# Patient Record
Sex: Female | Born: 1965 | Race: White | Hispanic: No | Marital: Married | Smoking: Former smoker
Health system: Southern US, Community
[De-identification: ages and names within clinical notes are randomized; demographics above are authoritative.]

## PROBLEM LIST (undated history)

## (undated) DIAGNOSIS — R51 Headache: Secondary | ICD-10-CM

## (undated) DIAGNOSIS — Z87442 Personal history of urinary calculi: Secondary | ICD-10-CM

## (undated) DIAGNOSIS — R519 Headache, unspecified: Secondary | ICD-10-CM

## (undated) HISTORY — PX: BACK SURGERY: SHX140

## (undated) HISTORY — DX: Headache: R51

## (undated) HISTORY — DX: Headache, unspecified: R51.9

---

## 1998-08-02 ENCOUNTER — Ambulatory Visit (HOSPITAL_COMMUNITY): Admission: RE | Admit: 1998-08-02 | Discharge: 1998-08-02 | Payer: Self-pay | Admitting: Obstetrics & Gynecology

## 1998-10-22 ENCOUNTER — Inpatient Hospital Stay (HOSPITAL_COMMUNITY): Admission: AD | Admit: 1998-10-22 | Discharge: 1998-10-26 | Payer: Self-pay | Admitting: Obstetrics and Gynecology

## 1998-12-12 ENCOUNTER — Other Ambulatory Visit: Admission: RE | Admit: 1998-12-12 | Discharge: 1998-12-12 | Payer: Self-pay | Admitting: Obstetrics and Gynecology

## 2000-02-10 ENCOUNTER — Other Ambulatory Visit: Admission: RE | Admit: 2000-02-10 | Discharge: 2000-02-10 | Payer: Self-pay | Admitting: Obstetrics and Gynecology

## 2001-02-11 ENCOUNTER — Other Ambulatory Visit: Admission: RE | Admit: 2001-02-11 | Discharge: 2001-02-11 | Payer: Self-pay | Admitting: Obstetrics and Gynecology

## 2002-03-10 ENCOUNTER — Other Ambulatory Visit: Admission: RE | Admit: 2002-03-10 | Discharge: 2002-03-10 | Payer: Self-pay | Admitting: Obstetrics and Gynecology

## 2003-08-14 ENCOUNTER — Other Ambulatory Visit: Admission: RE | Admit: 2003-08-14 | Discharge: 2003-08-14 | Payer: Self-pay | Admitting: Obstetrics and Gynecology

## 2004-11-22 ENCOUNTER — Other Ambulatory Visit: Admission: RE | Admit: 2004-11-22 | Discharge: 2004-11-22 | Payer: Self-pay | Admitting: Obstetrics and Gynecology

## 2014-02-15 ENCOUNTER — Telehealth: Payer: Self-pay

## 2014-02-15 ENCOUNTER — Ambulatory Visit: Payer: BC Managed Care – PPO | Admitting: Family Medicine

## 2014-02-15 VITALS — BP 122/82 | HR 83 | Temp 97.8°F | Resp 16 | Ht 64.0 in | Wt 184.0 lb

## 2014-02-15 DIAGNOSIS — E663 Overweight: Secondary | ICD-10-CM

## 2014-02-15 DIAGNOSIS — M109 Gout, unspecified: Secondary | ICD-10-CM

## 2014-02-15 MED ORDER — COLCHICINE 0.6 MG PO TABS: ORAL_TABLET | ORAL | Status: DC

## 2014-02-15 MED ORDER — INDOMETHACIN 50 MG PO CAPS: 50.0000 mg | ORAL_CAPSULE | Freq: Three times a day (TID) | ORAL | Status: DC

## 2014-02-15 NOTE — Telephone Encounter (Signed)
Called her back and LMOM.  Assuming she picked up the indomethacin- yes, this will be helpful on its own most likely.  Let me know if not better in the next couple of days.  If she got the colchicine, then add OTC ibuprofen

## 2014-02-15 NOTE — Patient Instructions (Addendum)
We are going to treat you for gout with colchicine (you can save the rest of the rx to use if you have gout again in the future.   You can also use the indomethacin for pain and inflammation- just use this as needed  Let me know if you are not better soon, or if this becomes a chronic problem

## 2014-02-15 NOTE — Telephone Encounter (Signed)
COPLAND - Patient said one of the medicines that she was prescribed for the gout is to expensive.  Will the anti-inflammatory medicine clear up the gout by itself?  If not and she does need the second one, is there a generic or cheaper medication she could take?  [phone]

## 2014-02-15 NOTE — Progress Notes (Signed)
[facility] [address], [place] [id number] [phone]  Date:  02/15/2014   Name:  Sharon KarvonenSuzanne M Robbins   DOB:  09/22/1966   MRN:  [id number]  PCP:  No PCP Per Patient    Chief Complaint: Foot Pain   History of Present Illness:  Sharon KarvonenSuzanne M Robbins is a 48 y.o. very pleasant female patient who presents with the following:  She awoke a couple of days ago and noted pain and swelling in her right 1st MTP.  She has never had this in the past, she has never had gout before.  However she did some reading online and thought this may be gout.  She has not eaten anything out of the ordinary although she did eat some shrimp the night before her sx began.  She did not injure the area.    She currently has her menses She is generally healthy otherwise except for being overweight   There are no active problems to display for this patient.   Past Medical History  Diagnosis Date  . Headache     Past Surgical History  Procedure Laterality Date  . Cesarean section      History  Substance Use Topics  . Smoking status: Former Games developermoker  . Smokeless tobacco: Not on file  . Alcohol Use: Not on file    Family History  Problem Relation Age of Onset  . Lupus Mother   . Cancer Father   . Cancer Maternal Grandmother   . Stroke Paternal Grandmother     No Known Allergies  Medication list has been reviewed and updated.  No current outpatient prescriptions on file prior to visit.   No current facility-administered medications on file prior to visit.    Review of Systems:  As per HPI- otherwise negative.   Physical Examination: Filed Vitals:   02/15/14 1314  BP: 122/82  Pulse: 83  Temp: 97.8 F (36.6 C)  Resp: 16   Filed Vitals:   02/15/14 1314  Height: 5\' 4"  (1.626 m)  Weight: 184 lb (83.462 kg)   Body mass index is 31.57 kg/(m^2). Ideal Body Weight: Weight in (lb) to have BMI = 25: 145.3  GEN: WDWN, NAD, Non-toxic, A & O x 3 HEENT: Atraumatic,  Normocephalic. Neck supple. No masses, No LAD. Ears and Nose: No external deformity. CV: RRR, No M/G/R. No JVD. No thrill. No extra heart sounds. PULM: CTA B, no wheezes, crackles, rhonchi. No retractions. No resp. distress. No accessory muscle use. ABD: S, NT, ND, +BS. No rebound. No HSM. EXTR: No c/c/e NEURO Normal gait.  PSYCH: Normally interactive. Conversant. Not depressed or anxious appearing.  Calm demeanor.  Right foot: tenderness and mild swelling/ mild redness and warmth at first MTP.  Likely gout.  Otherwise the knee, calf, foot and ankle are negative  Assessment and Plan: Gout of big toe - Plan: colchicine 0.6 MG tablet, indomethacin (INDOCIN) 50 MG capsule treat for likely gout with colchicine and indomethacin as above.  She will let me know if not better soon- Sooner if worse.      Signed Abbe AmsterdamJessica Copland, MD

## 2014-02-17 ENCOUNTER — Telehealth: Payer: Self-pay

## 2014-02-17 NOTE — Telephone Encounter (Signed)
Pt called, reported that in the morning her gout is improved, but then as she is up on her foot pain worsens during the day and is still definitely swollen. She did not p/up the colchicine bc it costs $75 and was trying the indomethacin first. Pt asked if there is any less expensive med that would clear out the uric acid, or if the gout will clear up on its own soon without any other med, or if Dr Patsy Lageropland recommends just getting the colchicine? Please advise.

## 2014-02-17 NOTE — Telephone Encounter (Signed)
Called her back- LMOM.  Call pharmacy and ask if she could get just a few pills for cheaper- maybe 5 or 10. That way she can try it and see if effective Let me know if any other concerns

## 2014-06-28 ENCOUNTER — Telehealth: Payer: Self-pay

## 2014-06-28 NOTE — Telephone Encounter (Signed)
Left message on machine to call back  

## 2014-06-28 NOTE — Telephone Encounter (Signed)
Spoke with pt. Advised strongly that they would have to RTC. She says their whole family has lice and that the 2 children were able to get something called in by their peds but not for the pt and her husband. She says she is unable to afford to come in and begged me to ask if we could do this. Please advise. Thanks

## 2014-06-28 NOTE — Telephone Encounter (Signed)
1. Best treatment I've ever used for lice is Cetaphil lotion treatment (instructions on line; worked much better for me than prescription product).  2. What other OTC treatments has she tried?  What were their kids given?

## 2014-06-28 NOTE — Telephone Encounter (Addendum)
Patient has tried OTC remedies to treat her lice. She wants a script called in her to help her get rid of it. [facility]. Patient also requested a script for her husband [name] DOB: 05/20/1962 however he has not been seen in [facility] at [facility] ever. He needs an OV possibly. Please advise   [phone]

## 2014-06-28 NOTE — Telephone Encounter (Signed)
I spoke with this patient describing methods for lice treatment and recommendations.  She is going to try the Cetaphil cleanser method, recommended by both me and her daughter's pediatrician's office.

## 2014-07-12 ENCOUNTER — Telehealth: Payer: Self-pay

## 2014-07-12 NOTE — Telephone Encounter (Signed)
Pt have questions regarding gout, was seen before for it. Please call [phone]

## 2014-07-22 ENCOUNTER — Emergency Department (HOSPITAL_COMMUNITY): Payer: BC Managed Care – PPO

## 2014-07-22 ENCOUNTER — Encounter (HOSPITAL_COMMUNITY): Payer: Self-pay | Admitting: Emergency Medicine

## 2014-07-22 ENCOUNTER — Emergency Department (HOSPITAL_COMMUNITY)
Admission: EM | Admit: 2014-07-22 | Discharge: 2014-07-22 | Disposition: A | Discharge status: Home / Self Care | Payer: BC Managed Care – PPO | Attending: Emergency Medicine | Admitting: Emergency Medicine

## 2014-07-22 DIAGNOSIS — M5441 Lumbago with sciatica, right side: Secondary | ICD-10-CM | POA: Diagnosis not present

## 2014-07-22 DIAGNOSIS — M5442 Lumbago with sciatica, left side: Secondary | ICD-10-CM | POA: Diagnosis not present

## 2014-07-22 DIAGNOSIS — M545 Low back pain: Secondary | ICD-10-CM | POA: Diagnosis present

## 2014-07-22 DIAGNOSIS — Z79899 Other long term (current) drug therapy: Secondary | ICD-10-CM | POA: Diagnosis not present

## 2014-07-22 MED ORDER — FENTANYL CITRATE 0.05 MG/ML IJ SOLN
50.0000 ug | Freq: Once | INTRAMUSCULAR | Status: AC
  Administered 2014-07-22: 50 ug via INTRAVENOUS

## 2014-07-22 MED ORDER — OXYCODONE HCL 5 MG PO TABS
5.0000 mg | ORAL_TABLET | ORAL | Status: AC | PRN
Start: 2014-07-22 — End: ?

## 2014-07-22 MED ORDER — ONDANSETRON HCL 4 MG/2ML IJ SOLN
INTRAMUSCULAR | Status: DC
Start: 2014-07-22 — End: 2014-07-23
  Filled 2014-07-22: qty 2

## 2014-07-22 MED ORDER — HYDROMORPHONE HCL 1 MG/ML IJ SOLN
1.0000 mg | Freq: Once | INTRAMUSCULAR | Status: AC
  Administered 2014-07-22: 1 mg via INTRAVENOUS
  Filled 2014-07-22: qty 1

## 2014-07-22 MED ORDER — ONDANSETRON HCL 4 MG/2ML IJ SOLN
4.0000 mg | Freq: Once | INTRAMUSCULAR | Status: AC
  Administered 2014-07-22: 4 mg via INTRAVENOUS

## 2014-07-22 MED ORDER — DIAZEPAM 5 MG/ML IJ SOLN
5.0000 mg | Freq: Once | INTRAMUSCULAR | Status: AC
  Administered 2014-07-22: 5 mg via INTRAVENOUS
  Filled 2014-07-22: qty 2

## 2014-07-22 MED ORDER — OXYCODONE-ACETAMINOPHEN 10-325 MG PO TABS: 1.0000 | ORAL_TABLET | ORAL | Status: DC | PRN

## 2014-07-22 MED ORDER — HYDROMORPHONE HCL 1 MG/ML IJ SOLN
1.0000 mg | Freq: Once | INTRAMUSCULAR | Status: AC
Start: 2014-07-22 — End: 2014-07-22
  Administered 2014-07-22: 1 mg via INTRAVENOUS
  Filled 2014-07-22: qty 1

## 2014-07-22 MED ORDER — FENTANYL CITRATE 0.05 MG/ML IJ SOLN
INTRAMUSCULAR | Status: DC
Start: 2014-07-22 — End: 2014-07-23
  Filled 2014-07-22: qty 2

## 2014-07-22 MED ORDER — LUBIPROSTONE 24 MCG PO CAPS: 24.0000 ug | ORAL_CAPSULE | Freq: Two times a day (BID) | ORAL | Status: AC

## 2014-07-22 NOTE — ED Notes (Signed)
Pt returned back to room following MRI

## 2014-07-22 NOTE — Discharge Instructions (Signed)
1. Medications: Percocet for pain, Roxicodone for breakthrough pain, usual home medications 2. Treatment: rest, drink plenty of fluids, continue to use your antiinflammatories and muscle relaxers 3. Follow Up: Please followup with Dr. Ranell PatrickNorris in 3 days for discussion of your diagnoses and further evaluation after today's visit;     Back Pain, Adult Back pain is very common. The pain often gets better over time. The cause of back pain is usually not dangerous. Most people can learn to manage their back pain on their own.  HOME CARE   Stay active. Start with short walks on flat ground if you can. Try to walk farther each day.  Do not sit, drive, or stand in one place for more than 30 minutes. Do not stay in bed.  Do not avoid exercise or work. Activity can help your back heal faster.  Be careful when you bend or lift an object. Bend at your knees, keep the object close to you, and do not twist.  Sleep on a firm mattress. Lie on your side, and bend your knees. If you lie on your back, put a pillow under your knees.  Only take medicines as told by your doctor.  Put ice on the injured area.  Put ice in a plastic bag.  Place a towel between your skin and the bag.  Leave the ice on for 15-20 minutes, 03-04 times a day for the first 2 to 3 days. After that, you can switch between ice and heat packs.  Ask your doctor about back exercises or massage.  Avoid feeling anxious or stressed. Find good ways to deal with stress, such as exercise. GET HELP RIGHT AWAY IF:   Your pain does not go away with rest or medicine.  Your pain does not go away in 1 week.  You have new problems.  You do not feel well.  The pain spreads into your legs.  You cannot control when you poop (bowel movement) or pee (urinate).  Your arms or legs feel weak or lose feeling (numbness).  You feel sick to your stomach (nauseous) or throw up (vomit).  You have belly (abdominal) pain.  You feel like you may  pass out (faint). MAKE SURE YOU:   Understand these instructions.  Will watch your condition.  Will get help right away if you are not doing well or get worse. Document Released: 03/10/2008 Document Revised: 12/15/2011 Document Reviewed: 01/24/2014 [facility] Patient Information 2015 [place] [place]. This information is not intended to replace advice given to you by your health care provider. Make sure you discuss any questions you have with your health care provider.

## 2014-07-22 NOTE — ED Notes (Signed)
EDP at bedside  

## 2014-07-22 NOTE — ED Notes (Signed)
Hannah, PA at bedside  

## 2014-07-22 NOTE — ED Notes (Signed)
Per pt sts 3 weeks or worsening back pain. sts was sent here by doctor for MRI. Pt unable to sit or lay down due to the pain,.

## 2014-07-22 NOTE — ED Provider Notes (Signed)
CSN: [id number]     Arrival date & time 07/22/14  1810 History   First MD Initiated Contact with Patient 07/22/14 1847     Chief Complaint  Patient presents with  . Back Pain     (Consider location/radiation/quality/duration/timing/severity/associated sxs/prior Treatment) The history is provided by the patient and medical records. No language interpreter was used.    Sharon Robbins is a 48 y.o. female  with Hx of DDD, scoliosis and arthritis in her low back presents to the Emergency Department complaining of gradual, persistent, progressively worsening low back pain onset 3 weeks ago worsening 3 days ago.  Pt was seen by Dr. Ranell PatrickNorris on 07/20/14 and was given Vicodin and robaxin without relief.  Pt reports she texted Dr. Ranell PatrickNorris to tell him she was here and he was to call the ED.  Pt reports the fentanyl was not helpful.  Pt reports the pain is sometimes dull and achy when she is lying but it becomes sharp with movement.   Pt reports the pain is worse on the left.  Pt reports her chronic pain is normally a 3-5/10 but pain becomes a 10/10 with movement.  Pt reports the pain radiates down both legs to approx the knee bilaterally.  She denies saddle anesthesia, gait disturbance, loss of bowel or bladder control. Associated symptoms include low back spasms.  Pt denies fever, chills, headache, neck pain chest pain, shortness of breath, abdominal pain, nausea, vomiting, diarrhea, weakness in her legs, numbness, tingling, gait disturbance. Patient denies IV drug use, use of anticoagulants, recent surgeries.     Past Medical History  Diagnosis Date  . Headache    Past Surgical History  Procedure Laterality Date  . Cesarean section     Family History  Problem Relation Age of Onset  . Lupus Mother   . Cancer Father   . Cancer Maternal Grandmother   . Stroke Paternal Grandmother    History  Substance Use Topics  . Smoking status: Former Games developermoker  . Smokeless tobacco: Not on file  . Alcohol Use:  Not on file   OB History   Grav Para Term Preterm Abortions TAB SAB Ect Mult Living                 Review of Systems  Constitutional: Negative for fever and fatigue.  Respiratory: Negative for chest tightness and shortness of breath.   Cardiovascular: Negative for chest pain.  Gastrointestinal: Negative for nausea, vomiting, abdominal pain and diarrhea.  Genitourinary: Negative for dysuria, urgency, frequency and hematuria.  Musculoskeletal: Positive for back pain and gait problem ( 2/2 pain). Negative for joint swelling, neck pain and neck stiffness.  Skin: Negative for rash.  Neurological: Negative for weakness, light-headedness, numbness and headaches.  All other systems reviewed and are negative.     Allergies  Review of patient's allergies indicates no known allergies.  Home Medications   Prior to Admission medications   Medication Sig Start Date End Date Taking? Authorizing Provider  clobetasol cream (TEMOVATE) 0.05 % Apply 1 application topically 2 (two) times daily.   Yes Historical Provider, MD  HYDROcodone-acetaminophen (NORCO/VICODIN) 5-325 MG per tablet Take 1-2 tablets by mouth every 6 (six) hours as needed for moderate pain.   Yes Historical Provider, MD  ibuprofen (ADVIL,MOTRIN) 200 MG tablet Take 400 mg by mouth every 6 (six) hours as needed for moderate pain.   Yes Historical Provider, MD  imipramine (TOFRANIL) 50 MG tablet Take 50 mg by mouth at bedtime.  Yes Historical Provider, MD  methocarbamol (ROBAXIN) 500 MG tablet Take 500 mg by mouth every 6 (six) hours as needed for muscle spasms.   Yes Historical Provider, MD  oxyCODONE (ROXICODONE) 5 MG immediate release tablet Take 1 tablet (5 mg total) by mouth every 4 (four) hours as needed for breakthrough pain. 07/22/14   Jylian Pappalardo, PA-C  oxyCODONE-acetaminophen (PERCOCET) 10-325 MG per tablet Take 1 tablet by mouth every 4 (four) hours as needed for pain. 07/22/14   Reighn Kaplan, PA-C   BP  102/72  Pulse 89  Temp(Src) 98.3 F (36.8 C)  Resp 18  SpO2 92%  LMP 02/12/2014 Physical Exam  Nursing note and vitals reviewed. Constitutional: She appears well-developed and well-nourished. No distress.  HENT:  Head: Normocephalic and atraumatic.  Mouth/Throat: Oropharynx is clear and moist. No oropharyngeal exudate.  Eyes: Conjunctivae are normal.  Neck: Normal range of motion. Neck supple.  Full ROM without pain  Cardiovascular: Normal rate, regular rhythm, normal heart sounds and intact distal pulses.   No murmur heard. Pulmonary/Chest: Effort normal and breath sounds normal. No respiratory distress. She has no wheezes.  Abdominal: Soft. Bowel sounds are normal. She exhibits no distension. There is no tenderness.  Musculoskeletal:  Full range of motion of the T-spine and L-spine after pain control Mild tenderness to palpation of the spinous processes of the L-spine Significant tenderness to palpation of the bilateral paraspinous muscles of the L-spine  Lymphadenopathy:    She has no cervical adenopathy.  Neurological: She is alert. She has normal reflexes. She exhibits normal muscle tone. Coordination normal.  Speech is clear and goal oriented, follows commands Normal 5/5 strength in upper and lower extremities bilaterally including dorsiflexion and plantar flexion, strong and equal grip strength Sensation normal to light and sharp touch Moves extremities without ataxia, coordination intact Antalgic gait without foot drop or dragging of either leg Normal balance  Skin: Skin is warm and dry. No rash noted. She is not diaphoretic. No erythema.  Psychiatric: She has a normal mood and affect. Her behavior is normal.    ED Course  Procedures (including critical care time) Labs Review Labs Reviewed - No data to display  Imaging Review Mr Lumbar Spine Wo Contrast  07/22/2014   CLINICAL DATA:  Low back pain radiating into the legs and left hip for 1 week.  EXAM: MRI LUMBAR  SPINE WITHOUT CONTRAST  TECHNIQUE: Multiplanar, multisequence MR imaging of the lumbar spine was performed. No intravenous contrast was administered.  COMPARISON:  None.  FINDINGS: Images are mildly degraded by motion artifact. Patient was claustrophobic.  For the purposes of this dictation, the lowest well-formed intervertebral disc space is presumed to be L5-S1.  There is trace retrolisthesis of L4 on L5. Vertebral body heights are preserved without compression fracture. Disc desiccation is present from L2-3 to L5-S1. Mild disc space narrowing is present at L2-3 with prominent degenerative fatty marrow changes. There is diffuse narrowing of the lumbar spinal canal due to congenitally short pedicles. Conus medullaris is normal in signal and terminates at T12. 1 cm T2 hyperintense focus in the left renal hilum likely represents a cyst.  L1-2: Small right central disc protrusion results in mild right lateral recess stenosis, mildly posteriorly displacing the right L2 nerve root. No spinal canal or neural foraminal stenosis.  L2-3: Mild-to-moderate disc bulge asymmetric to the right results in mild right greater than left lateral recess stenosis without spinal canal or neural foraminal stenosis. Disc material may contact the right L2  nerve root lateral to the foramen.  L3-4: Mild circumferential disc bulge with broad left extraforaminal disc protrusion, mild facet hypertrophy, and congenitally short pedicles result in mild left greater than right lateral recess stenosis and mild left neural foraminal stenosis. Disc may contact the left L3 nerve root lateral to the foramen. No significant acquired spinal canal stenosis  L4-5: Mild-to-moderate circumferential disc bulging, superimposed left subarticular disc protrusion, congenitally short pedicles, and mild facet and ligamentum flavum hypertrophy result in moderate spinal stenosis, moderate left greater than right lateral recess stenosis, and no significant neural  foraminal stenosis.  L5-S1: Minimal disc bulging and mild facet and ligamentum flavum hypertrophy result in mild left greater than right lateral recess stenosis without significant spinal canal or neural foraminal stenosis. 2 cm synovial cyst is noted posterior and medial to the left L5-S1 facet joint outside of the spinal canal.  IMPRESSION: Multilevel lumbar disc degeneration facet arthrosis, most notable at L4-5 where there is multifactorial moderate spinal stenosis.   Electronically Signed   By: Sebastian AcheAllen  Grady   On: 07/22/2014 21:10     EKG Interpretation None      MDM   Final diagnoses:  Bilateral low back pain with sciatica, sciatica laterality unspecified   Sharon KarvonenSuzanne M Robbins presents with acute on chronic low back pain with bilateral sciatica. No neurological deficits and normal neuro exam.  Patient can walk but states is painful.  No loss of bowel or bladder control.  No concern for cauda equina.  No fever, night sweats, weight loss, h/o cancer, IVDU.  MRI pending.    10:22 PM MRI with Multilevel lumbar disc degeneration facet arthrosis, most notable at L4-5 where there is multifactorial moderate spinal stenosis.  Pt neurologic exam remains unremarkable and there are no findings on the MRI for which surgical intervention would be indicated tonight.    I have personally reviewed patient's vitals, nursing note and any pertinent labs or imaging.  I performed an undressed physical exam.    It has been determined that no acute conditions requiring further emergency intervention are present at this time. The patient/guardian have been advised of the diagnosis and plan. I reviewed all labs and imaging including any potential incidental findings. We have discussed signs and symptoms that warrant return to the ED, such as loss of bowel or bladder control, gait disturbance or worsening pain.  Patient/guardian has voiced understanding and agreed to follow-up with the PCP or specialist in 3  days.  Vital signs are stable at discharge.   BP 102/72  Pulse 89  Temp(Src) 98.3 F (36.8 C)  Resp 18  SpO2 92%  LMP 02/12/2014  The patient was discussed with Dr. Effie ShyWentz who agrees with the treatment plan.   Dahlia ClientHannah Hemi Chacko, PA-C 07/22/14 2223

## 2014-07-22 NOTE — ED Notes (Signed)
Patient transported to MRI 

## 2014-07-23 NOTE — ED Provider Notes (Signed)
Medical screening examination/treatment/procedure(s) were performed by non-physician practitioner and as supervising physician I was immediately available for consultation/collaboration.  Leary Mcnulty L Ohn Bostic, MD 07/23/14 0126 

## 2014-08-15 ENCOUNTER — Ambulatory Visit: Payer: BC Managed Care – PPO | Attending: Orthopedic Surgery

## 2014-08-15 DIAGNOSIS — R5381 Other malaise: Secondary | ICD-10-CM | POA: Insufficient documentation

## 2014-08-15 DIAGNOSIS — R293 Abnormal posture: Secondary | ICD-10-CM | POA: Diagnosis not present

## 2014-08-15 DIAGNOSIS — M25659 Stiffness of unspecified hip, not elsewhere classified: Secondary | ICD-10-CM | POA: Diagnosis not present

## 2014-08-15 DIAGNOSIS — M4806 Spinal stenosis, lumbar region: Secondary | ICD-10-CM | POA: Insufficient documentation

## 2014-08-17 ENCOUNTER — Ambulatory Visit: Payer: BC Managed Care – PPO | Admitting: Physical Therapy

## 2014-08-17 DIAGNOSIS — M4806 Spinal stenosis, lumbar region: Secondary | ICD-10-CM | POA: Diagnosis not present

## 2014-08-22 ENCOUNTER — Ambulatory Visit: Payer: BC Managed Care – PPO | Admitting: Physical Therapy

## 2014-08-22 DIAGNOSIS — M4806 Spinal stenosis, lumbar region: Secondary | ICD-10-CM | POA: Diagnosis not present

## 2014-08-24 ENCOUNTER — Ambulatory Visit: Payer: BC Managed Care – PPO

## 2014-08-24 DIAGNOSIS — M4806 Spinal stenosis, lumbar region: Secondary | ICD-10-CM | POA: Diagnosis not present

## 2014-08-29 ENCOUNTER — Encounter: Payer: BC Managed Care – PPO | Admitting: Physical Therapy

## 2014-09-05 ENCOUNTER — Ambulatory Visit: Payer: BC Managed Care – PPO | Attending: Orthopedic Surgery | Admitting: Physical Therapy

## 2014-09-05 DIAGNOSIS — R293 Abnormal posture: Secondary | ICD-10-CM | POA: Insufficient documentation

## 2014-09-05 DIAGNOSIS — M25659 Stiffness of unspecified hip, not elsewhere classified: Secondary | ICD-10-CM | POA: Insufficient documentation

## 2014-09-05 DIAGNOSIS — R5381 Other malaise: Secondary | ICD-10-CM | POA: Insufficient documentation

## 2014-09-05 DIAGNOSIS — M4806 Spinal stenosis, lumbar region: Secondary | ICD-10-CM | POA: Insufficient documentation

## 2014-09-07 ENCOUNTER — Ambulatory Visit: Payer: BC Managed Care – PPO | Admitting: Physical Therapy

## 2014-09-07 DIAGNOSIS — M4806 Spinal stenosis, lumbar region: Secondary | ICD-10-CM | POA: Diagnosis not present

## 2014-09-07 DIAGNOSIS — M25659 Stiffness of unspecified hip, not elsewhere classified: Secondary | ICD-10-CM | POA: Diagnosis not present

## 2014-09-07 DIAGNOSIS — R5381 Other malaise: Secondary | ICD-10-CM | POA: Diagnosis not present

## 2014-09-07 DIAGNOSIS — R293 Abnormal posture: Secondary | ICD-10-CM | POA: Diagnosis not present

## 2014-09-12 ENCOUNTER — Ambulatory Visit: Payer: BC Managed Care – PPO

## 2014-09-14 ENCOUNTER — Ambulatory Visit: Payer: BC Managed Care – PPO | Admitting: Physical Therapy

## 2014-09-14 DIAGNOSIS — M4806 Spinal stenosis, lumbar region: Secondary | ICD-10-CM | POA: Diagnosis not present

## 2014-09-18 ENCOUNTER — Other Ambulatory Visit: Payer: Self-pay | Admitting: Neurosurgery

## 2014-09-18 ENCOUNTER — Ambulatory Visit: Payer: BC Managed Care – PPO

## 2014-09-18 DIAGNOSIS — M5137 Other intervertebral disc degeneration, lumbosacral region: Secondary | ICD-10-CM

## 2014-09-25 ENCOUNTER — Other Ambulatory Visit: Payer: BC Managed Care – PPO

## 2014-10-02 ENCOUNTER — Other Ambulatory Visit: Payer: BC Managed Care – PPO

## 2014-10-04 ENCOUNTER — Ambulatory Visit
Admission: RE | Admit: 2014-10-04 | Discharge: 2014-10-04 | Disposition: A | Discharge status: Home / Self Care | Payer: BC Managed Care – PPO | Source: Ambulatory Visit | Attending: Neurosurgery | Admitting: Neurosurgery

## 2014-10-04 DIAGNOSIS — M5137 Other intervertebral disc degeneration, lumbosacral region: Secondary | ICD-10-CM

## 2014-10-04 MED ORDER — DIAZEPAM 5 MG PO TABS
10.0000 mg | ORAL_TABLET | Freq: Once | ORAL | Status: AC
Start: 2014-10-04 — End: 2014-10-04
  Administered 2014-10-04: 10 mg via ORAL

## 2014-10-04 MED ORDER — IOHEXOL 180 MG/ML  SOLN
17.0000 mL | Freq: Once | INTRAMUSCULAR | Status: AC | PRN
  Administered 2014-10-04: 17 mL via INTRATHECAL

## 2014-10-04 NOTE — Discharge Instructions (Signed)
Myelogram Discharge Instructions  1. Go home and rest quietly for the next 24 hours.  It is important to lie flat for the next 24 hours.  Get up only to go to the restroom.  You may lie in the bed or on a couch on your back, your stomach, your left side or your right side.  You may have one pillow under your head.  You may have pillows between your knees while you are on your side or under your knees while you are on your back.  2. DO NOT drive today.  Recline the seat as far back as it will go, while still wearing your seat belt, on the way home.  3. You may get up to go to the bathroom as needed.  You may sit up for 10 minutes to eat.  You may resume your normal diet and medications unless otherwise indicated.  Drink lots of extra fluids today and tomorrow.  4. The incidence of headache, nausea, or vomiting is about 5% (one in 20 patients).  If you develop a headache, lie flat and drink plenty of fluids until the headache goes away.  Caffeinated beverages may be helpful.  If you develop severe nausea and vomiting or a headache that does not go away with flat bed rest, call [phone].  5. You may resume normal activities after your 24 hours of bed rest is over; however, do not exert yourself strongly or do any heavy lifting tomorrow. If when you get up you have a headache when standing, go back to bed and force fluids for another 24 hours.  6. Call your physician for a follow-up appointment.  The results of your myelogram will be sent directly to your physician by the following day.  7. If you have any questions or if complications develop after you arrive home, please call [phone].  Discharge instructions have been explained to the patient.  The patient, or the person responsible for the patient, fully understands these instructions.      May resume Imipramine on Dec. 31, 2015, after 9:30 am.

## 2014-10-09 ENCOUNTER — Ambulatory Visit: Payer: BC Managed Care – PPO

## 2014-10-12 ENCOUNTER — Ambulatory Visit: Payer: BC Managed Care – PPO | Admitting: Physical Therapy

## 2014-12-01 ENCOUNTER — Ambulatory Visit
Admission: RE | Admit: 2014-12-01 | Discharge: 2014-12-01 | Disposition: A | Discharge status: Home / Self Care | Payer: BLUE CROSS/BLUE SHIELD | Source: Ambulatory Visit | Attending: Neurosurgery | Admitting: Neurosurgery

## 2014-12-01 ENCOUNTER — Other Ambulatory Visit: Payer: Self-pay | Admitting: Neurosurgery

## 2014-12-01 DIAGNOSIS — M5416 Radiculopathy, lumbar region: Secondary | ICD-10-CM

## 2014-12-01 MED ORDER — GADOBENATE DIMEGLUMINE 529 MG/ML IV SOLN
18.0000 mL | Freq: Once | INTRAVENOUS | Status: AC | PRN
  Administered 2014-12-01: 18 mL via INTRAVENOUS

## 2015-09-18 ENCOUNTER — Other Ambulatory Visit: Payer: Self-pay | Admitting: Orthopaedic Surgery

## 2015-09-18 DIAGNOSIS — M542 Cervicalgia: Secondary | ICD-10-CM

## 2015-10-02 ENCOUNTER — Ambulatory Visit
Admission: RE | Admit: 2015-10-02 | Discharge: 2015-10-02 | Disposition: A | Discharge status: Home / Self Care | Payer: BLUE CROSS/BLUE SHIELD | Source: Ambulatory Visit | Attending: Orthopaedic Surgery | Admitting: Orthopaedic Surgery

## 2015-10-02 DIAGNOSIS — M542 Cervicalgia: Secondary | ICD-10-CM

## 2016-12-22 ENCOUNTER — Ambulatory Visit: Payer: 59 | Attending: Family Medicine

## 2016-12-22 DIAGNOSIS — H8111 Benign paroxysmal vertigo, right ear: Secondary | ICD-10-CM

## 2016-12-22 DIAGNOSIS — R42 Dizziness and giddiness: Secondary | ICD-10-CM | POA: Diagnosis present

## 2016-12-22 DIAGNOSIS — R2689 Other abnormalities of gait and mobility: Secondary | ICD-10-CM | POA: Insufficient documentation

## 2016-12-22 NOTE — Patient Instructions (Signed)
   Look to the left and lie on your right side, hold until dizziness stops plus 20-30 seconds, Then sit up and quickly transition to lying on left side with forehead on sofa/bed, hold until dizziness subsides plus 20-30 seconds. Repeat 3 times, once a day.

## 2016-12-22 NOTE — Therapy (Signed)
[facility] [address] [place], [place], [address] Phone: [phone]   Fax:  [phone]  Physical Therapy Evaluation  Patient Details  Name: Sharon Robbins MRN: [id number] Date of Birth: Jan 09, 1966 Referring Provider: Dr. Modesto Charon  Encounter Date: 12/22/2016      PT End of Session - 12/22/16 0954    Visit Number 1   Number of Visits 5   Date for PT Re-Evaluation 01/21/17   Authorization Type [employer]   PT Start Time 0855   PT Stop Time 0940   PT Time Calculation (min) 45 min   Activity Tolerance Patient tolerated treatment well   Behavior During Therapy [facility] for tasks assessed/performed      Past Medical History:  Diagnosis Date  . Headache     Past Surgical History:  Procedure Laterality Date  . CESAREAN SECTION      There were no vitals filed for this visit.       Subjective Assessment - 12/22/16 0900    Subjective Pt reported dizziness began upon waking on 12/16/16 with insidious onset. Pt woke up with vertigo, she was unable to get out of bed and her husband had to stay home to care for her that day. Pt describes dizziness as spinning with some wooziness and pressure in head. Pt reports intensity10/10 at worst and now 4/10. Pt reports some nasal congestion prior to dizziness onset. However, pt reported MD cleared pt for inflammation/viral illness. Pt has gradually felt better after treatment for dizziness at MD office on 12/17/16.   Pertinent History Migraines (but has not experience one in ~ 15 years), chronic back pain   Patient Stated Goals To not have dizziness or pressure or imbalance.    Currently in Pain? No/denies            [facility] PT Assessment - 12/22/16 0910      Assessment   Medical Diagnosis Vertigo   Referring Provider Dr. Modesto Charon   Onset Date/Surgical Date 12/16/16   Prior Therapy none     Precautions   Precautions Fall     Restrictions   Weight Bearing Restrictions No     Balance  Screen   Has the patient fallen in the past 6 months No   Has the patient had a decrease in activity level because of a fear of falling?  Yes  pt has limited activity due to imbalance.    Is the patient reluctant to leave their home because of a fear of falling?  No     Home Environment   Living Environment Private residence   Living Arrangements Spouse/significant other;Children   Available Help at Discharge Family   Type of Home House   Home Access Stairs to enter   Entrance Stairs-Number of Steps 2   Entrance Stairs-Rails None   Home Layout One level   Home Equipment None     Prior Function   Level of Independence Independent   Vocation Full time employment   Vocation Requirements Works from home, Development worker, [employer] (customer service-on the phone)   Leisure [place] out with friends, listen to music     Cognition   Overall Cognitive Status Within Functional Limits for tasks assessed     Sensation   Additional Comments Denied N/T.     Ambulation/Gait   Ambulation/Gait Yes   Ambulation/Gait Assistance 5: Supervision   Ambulation/Gait Assistance Details Guarded   Ambulation Distance (Feet) 100 Feet   Assistive device None   Gait Pattern  Step-through pattern;Decreased stride length;Decreased trunk rotation   Ambulation Surface Level;Indoor   Gait velocity 2.55ft/sec.  none            Vestibular Assessment - 12/22/16 0915      Vestibular Assessment   General Observation Pt amb. in guarded manner with intermittent LOB during turns, but able to self correct with stepping strategy or placing hand on wall/counter.      Symptom Behavior   Type of Dizziness Spinning   Frequency of Dizziness Daily (getting OOB)   Duration of Dizziness <10 seconds   Aggravating Factors Turning head quickly;Rolling to right   Relieving Factors --  looking left     Occulomotor Exam   Occulomotor Alignment Normal   Spontaneous Absent   Gaze-induced Absent   Smooth Pursuits Intact   pt reported slight incr. in head pressure   Saccades Intact   Comment No incr. in dizziness. B HIT: (-)     Vestibulo-Occular Reflex   VOR 1 Head Only (x 1 viewing) Pt reported slight wooziness (2/10) and discomfort due to pressure in head.   VOR Cancellation Normal     Positional Testing   Dix-Hallpike Dix-Hallpike Right;Dix-Hallpike Left   Sidelying Test Sidelying Right   Horizontal Canal Testing Horizontal Canal Right;Horizontal Canal Left     Dix-Hallpike Right   Dix-Hallpike Right Duration 10 seconds. Pt kept eyes closed and stopped prior to extending head of OOB, and closed eyes 2/2 dizziness. No nystagmus noted once pt was able to obtain full neck ext. but sx's were also resolved per pt report.   Dix-Hallpike Right Symptoms Other (comment)  see above     Dix-Hallpike Left   Dix-Hallpike Left Duration none   Dix-Hallpike Left Symptoms No nystagmus     Sidelying Right   Sidelying Right Duration < 5 seconds   Sidelying Right Symptoms Upbeat, right rotatory nystagmus     Horizontal Canal Right   Horizontal Canal Right Duration none   Horizontal Canal Right Symptoms Normal     Horizontal Canal Left   Horizontal Canal Left Duration none   Horizontal Canal Left Symptoms Normal                Vestibular Treatment/Exercise - 12/22/16 0944      Vestibular Treatment/Exercise   Vestibular Treatment Provided Canalith Repositioning   Canalith Repositioning Semont Procedure Right Posterior     Semont Procedure Right Posterior   Number of Reps  1   Overall Response  Symptoms Resolved   Response Details  Pt reported dizziness resolved after 1 rep. See HEP for details.                PT Education - 12/22/16 [phone]    Education provided Yes   Education Details PT educated pt on PT POC, exam findings, frequency/duration. PTprovided pt with Semont HEP.  Pt required increased time during HEP, as she was very hesitant to provoke dizziness. PT had to reassure pt and  educate pt multiple times prior to pt attempting treatment.    Person(s) Educated Patient   Methods Explanation;Demonstration;Verbal cues;Handout   Comprehension Returned demonstration;Verbalized understanding          PT Short Term Goals - 12/22/16 1002      PT SHORT TERM GOAL #1   Title same as LTGs           PT Long Term Goals - 12/22/16 1002      PT LONG TERM GOAL #1   Title Pt will  report 0/10 dizziness during all positional testing and bed mobility to improve quality of life and safety. TARGET DATE FOR ALL LTGS: 01/19/17   Status New     PT LONG TERM GOAL #2   Title Perform FGA and write goal as indicated.   Status New     PT LONG TERM GOAL #3   Title Pt will be IND in HEP to reduce dizziness, improve balance, and guarded movements during amb.    Status New               Plan - 12/22/16 0955    Clinical Impression Statement Pt is a pleasant 51y/o female presenting to OPPT neuro with vertigo. Pt's PMH significant for migraines and chronic back pain. Pt exhibited nervousness during R Dix-Hallpike and closed eyes, therefore, PT had pt performed positional testing in R sidelying position. R sidelying test was positive for R upbeating rotary nystagmus and dizziness, with dizziness resolving with Semont treatment. Sx's consistent with R pBPPV. All other positional testing negative for sx's and nystagmus. Pt did experienced dizziness when returning to seated position after R Dix-Hallpike testing. Pt also experienced dizziness during VOR and would benefit from gaze stabilization exercises to improve dizziness. Pt also noted to be very guarded to head turns and may benefit from assessment of cervical ROM and flexibility. Pt's gait speed was WNL but pt did experience LOB and dizziness while performing turns. Pt would benefit from skilled PT to improve safety and dizziness during functional mobility.    Rehab Potential Good   Clinical Impairments Affecting Rehab Potential hx of  migraines ~ 15 years ago   PT Frequency 1x / week   PT Duration 4 weeks   PT Treatment/Interventions ADLs/Self Care Home Management;Canalith Repostioning;Neuromuscular re-education;Balance training;Therapeutic exercise;Therapeutic activities;Functional mobility training;Stair training;Gait training;Patient/family education;Manual techniques;Vestibular   PT Next Visit Plan Re-assess for R pBPPV and treat as indicated. Perform FGA and provided pt with gaze stabilization HEP. Review Semont HEP as needed.    PT Home Exercise Plan Semont HEP   Consulted and Agree with Plan of Care Patient      Patient will benefit from skilled therapeutic intervention in order to improve the following deficits and impairments:  Abnormal gait, Dizziness, Decreased balance, Decreased activity tolerance, Impaired flexibility  Visit Diagnosis: BPPV (benign paroxysmal positional vertigo), right - Plan: PT plan of care cert/re-cert  Dizziness and giddiness - Plan: PT plan of care cert/re-cert  Other abnormalities of gait and mobility - Plan: PT plan of care cert/re-cert     Problem List Patient Active Problem List   Diagnosis Date Noted  . Overweight(278.02) 02/15/2014    Muranda Coye L 12/22/2016, 10:07 AM  [facility] [address] [place], [place], [address] Phone: [phone]   Fax:  [phone]  Name: Sharon Robbins MRN: [id number] Date of Birth: 03/31/1966  Zerita BoersJennifer Vy Badley, PT,DPT 12/22/16 10:08 AM Phone: [phone] Fax: [phone]

## 2016-12-24 ENCOUNTER — Other Ambulatory Visit: Payer: Self-pay | Admitting: Family Medicine

## 2016-12-24 DIAGNOSIS — R42 Dizziness and giddiness: Secondary | ICD-10-CM

## 2016-12-29 ENCOUNTER — Ambulatory Visit: Payer: 59

## 2017-01-05 ENCOUNTER — Other Ambulatory Visit: Payer: 59

## 2017-01-06 ENCOUNTER — Ambulatory Visit: Payer: 59 | Attending: Family Medicine

## 2017-01-12 ENCOUNTER — Ambulatory Visit: Payer: 59 | Admitting: Physical Therapy

## 2018-05-19 ENCOUNTER — Encounter (HOSPITAL_COMMUNITY): Payer: Self-pay | Admitting: Emergency Medicine

## 2018-05-19 ENCOUNTER — Other Ambulatory Visit: Payer: Self-pay

## 2018-05-19 ENCOUNTER — Emergency Department (HOSPITAL_COMMUNITY)
Admission: EM | Admit: 2018-05-19 | Discharge: 2018-05-19 | Disposition: A | Discharge status: Home / Self Care | Payer: 59 | Attending: Emergency Medicine | Admitting: Emergency Medicine

## 2018-05-19 ENCOUNTER — Emergency Department (HOSPITAL_COMMUNITY): Payer: 59

## 2018-05-19 DIAGNOSIS — R1031 Right lower quadrant pain: Secondary | ICD-10-CM | POA: Diagnosis present

## 2018-05-19 DIAGNOSIS — N200 Calculus of kidney: Secondary | ICD-10-CM | POA: Insufficient documentation

## 2018-05-19 DIAGNOSIS — Z79899 Other long term (current) drug therapy: Secondary | ICD-10-CM | POA: Insufficient documentation

## 2018-05-19 DIAGNOSIS — Z87891 Personal history of nicotine dependence: Secondary | ICD-10-CM | POA: Insufficient documentation

## 2018-05-19 LAB — URINALYSIS, ROUTINE W REFLEX MICROSCOPIC
Bilirubin Urine: NEGATIVE
GLUCOSE, UA: NEGATIVE mg/dL
Ketones, ur: NEGATIVE mg/dL
Leukocytes, UA: NEGATIVE
Nitrite: NEGATIVE
PROTEIN: 30 mg/dL — AB
RBC / HPF: >50 RBC/hpf — ABNORMAL HIGH (ref 0–5)
Specific Gravity, Urine: 1.026 (ref 1.005–1.030)
pH: 5 (ref 5.0–8.0)

## 2018-05-19 LAB — COMPREHENSIVE METABOLIC PANEL
ALBUMIN: 3.9 g/dL (ref 3.5–5.0)
ALK PHOS: 70 U/L (ref 38–126)
ALT: 27 U/L (ref 0–44)
ANION GAP: 7 (ref 5–15)
AST: 24 U/L (ref 15–41)
BUN: 11 mg/dL (ref 6–20)
CALCIUM: 9.4 mg/dL (ref 8.9–10.3)
CO2: 25 mmol/L (ref 22–32)
CREATININE: 0.91 mg/dL (ref 0.44–1.00)
Chloride: 106 mmol/L (ref 98–111)
GFR calc Af Amer: >60 mL/min (ref >60)
GFR calc non Af Amer: >60 mL/min (ref >60)
GLUCOSE: 141 mg/dL — AB (ref 70–99)
Potassium: 4.2 mmol/L (ref 3.5–5.1)
Sodium: 138 mmol/L (ref 135–145)
Total Bilirubin: 0.5 mg/dL (ref 0.3–1.2)
Total Protein: 7.1 g/dL (ref 6.5–8.1)

## 2018-05-19 LAB — CBC
HCT: 42.8 % (ref 36.0–46.0)
HEMOGLOBIN: 13.8 g/dL (ref 12.0–15.0)
MCH: 29.1 pg (ref 26.0–34.0)
MCHC: 32.2 g/dL (ref 30.0–36.0)
MCV: 90.3 fL (ref 78.0–100.0)
Platelets: 262 10*3/uL (ref 150–400)
RBC: 4.74 MIL/uL (ref 3.87–5.11)
RDW: 12.6 % (ref 11.5–15.5)
WBC: 8.3 10*3/uL (ref 4.0–10.5)

## 2018-05-19 LAB — I-STAT BETA HCG BLOOD, ED (MC, WL, AP ONLY): I-stat hCG, quantitative: <5 m[IU]/mL (ref <5)

## 2018-05-19 LAB — LIPASE, BLOOD: Lipase: 37 U/L (ref 11–51)

## 2018-05-19 MED ORDER — OXYCODONE-ACETAMINOPHEN 5-325 MG PO TABS
1.0000 | ORAL_TABLET | Freq: Once | ORAL | Status: AC
  Administered 2018-05-19: 1 via ORAL
  Filled 2018-05-19: qty 1

## 2018-05-19 MED ORDER — OXYCODONE-ACETAMINOPHEN 5-325 MG PO TABS: 1.0000 | ORAL_TABLET | Freq: Four times a day (QID) | ORAL | 0 refills | Status: DC | PRN

## 2018-05-19 MED ORDER — KETOROLAC TROMETHAMINE 10 MG PO TABS: 10.0000 mg | ORAL_TABLET | Freq: Four times a day (QID) | ORAL | 0 refills | Status: AC | PRN

## 2018-05-19 MED ORDER — KETOROLAC TROMETHAMINE 30 MG/ML IJ SOLN
30.0000 mg | Freq: Once | INTRAMUSCULAR | Status: AC
  Administered 2018-05-19: 30 mg via INTRAMUSCULAR
  Filled 2018-05-19: qty 1

## 2018-05-19 MED ORDER — TAMSULOSIN HCL 0.4 MG PO CAPS: 0.4000 mg | ORAL_CAPSULE | Freq: Every day | ORAL | 0 refills | Status: DC

## 2018-05-19 MED ORDER — TAMSULOSIN HCL 0.4 MG PO CAPS: 0.4000 mg | ORAL_CAPSULE | Freq: Every day | ORAL | 0 refills | Status: AC

## 2018-05-19 MED ORDER — KETOROLAC TROMETHAMINE 30 MG/ML IJ SOLN: 30.0000 mg | Freq: Once | INTRAMUSCULAR | Status: DC

## 2018-05-19 MED ORDER — ONDANSETRON 4 MG PO TBDP
4.0000 mg | ORAL_TABLET | Freq: Once | ORAL | Status: AC
  Administered 2018-05-19: 4 mg via ORAL
  Filled 2018-05-19: qty 1

## 2018-05-19 NOTE — ED Triage Notes (Signed)
Pt to ER for evaluation of right lower abd pain radiating into right flank onset one hour ago.

## 2018-05-19 NOTE — ED Notes (Signed)
Pt in CT.

## 2018-05-19 NOTE — ED Provider Notes (Signed)
[facility] Provider Note   CSN: [id number] Arrival date & time: 05/19/18  1303     History   Chief Complaint Chief Complaint  Patient presents with  . Flank Pain  . Abdominal Pain    HPI Sharon KarvonenSuzanne M Robbins is a 52 y.o. female.  HPI     52 year old female presents today with acute onset right-sided flank and abdominal pain. Patient notes she was at work this morning in her usual state of health when she developed nausea vomiting and severe sharp stabbing pain. Patient notes she cannot get comfortable, she denies any trauma, denies any preceding illness other than minor diarrhea over the last several days. History of kidney stones, no medications prior to arrival.    Past Medical History:  Diagnosis Date  . Headache     Patient Active Problem List   Diagnosis Date Noted  . Overweight(278.02) 02/15/2014    Past Surgical History:  Procedure Laterality Date  . CESAREAN SECTION       OB History   None      Home Medications    Prior to Admission medications   Medication Sig Start Date End Date Taking? Authorizing Provider  clobetasol cream (TEMOVATE) 0.05 % Apply 1 application topically 2 (two) times daily.    [provider]  HYDROcodone-acetaminophen (NORCO/VICODIN) 5-325 MG per tablet Take 1-2 tablets by mouth every 6 (six) hours as needed for moderate pain.    [provider]  ibuprofen (ADVIL,MOTRIN) 200 MG tablet Take 400 mg by mouth every 6 (six) hours as needed for moderate pain.    [provider]  imipramine (TOFRANIL) 50 MG tablet Take 50 mg by mouth at bedtime.    [provider]  ketorolac (TORADOL) 10 MG tablet Take 1 tablet (10 mg total) by mouth every 6 (six) hours as needed. 05/19/18   Jahvon Gosline, Tinnie GensJeffrey, PA-C  lubiprostone (AMITIZA) 24 MCG capsule Take 1 capsule (24 mcg total) by mouth 2 (two) times daily with a meal. Patient not taking: Reported on 12/22/2016 07/22/14    Muthersbaugh, Dahlia ClientHannah, PA-C  methocarbamol (ROBAXIN) 500 MG tablet Take 500 mg by mouth every 6 (six) hours as needed for muscle spasms.    [provider]  ondansetron (ZOFRAN) 4 MG tablet Take 4 mg by mouth every 8 (eight) hours as needed for nausea or vomiting.    [provider]  oxyCODONE (ROXICODONE) 5 MG immediate release tablet Take 1 tablet (5 mg total) by mouth every 4 (four) hours as needed for breakthrough pain. Patient not taking: Reported on 12/22/2016 07/22/14   Muthersbaugh, Dahlia ClientHannah, PA-C  oxyCODONE-acetaminophen (PERCOCET/ROXICET) 5-325 MG tablet Take 1 tablet by mouth every 6 (six) hours as needed for severe pain. 05/19/18   Kamel Haven, Tinnie GensJeffrey, PA-C  tamsulosin (FLOMAX) 0.4 MG CAPS capsule Take 1 capsule (0.4 mg total) by mouth daily. 05/19/18   Eyvonne MechanicHedges, Laurella Tull, PA-C    Family History Family History  Problem Relation Age of Onset  . Lupus Mother   . Cancer Father   . Cancer Maternal Grandmother   . Stroke Paternal Grandmother     Social History Social History   Tobacco Use  . Smoking status: Former Games developermoker  . Smokeless tobacco: Never Used  Substance Use Topics  . Alcohol use: Yes    Comment: 1-2 drinks on the weekend (special occasions)  . Drug use: Not on file     Allergies   Patient has no known allergies.   Review of Systems Review  of Systems  All other systems reviewed and are negative.  Physical Exam Updated Vital Signs BP (!) 138/98 (BP Location: Right Arm)   Pulse (!) 57   Temp 97.8 F (36.6 C) (Oral)   Resp 20   LMP 10/04/2014   SpO2 100%   Physical Exam  Constitutional: She is oriented to person, place, and time. She appears well-developed and well-nourished.  HENT:  Head: Normocephalic and atraumatic.  Eyes: Pupils are equal, round, and reactive to light. Conjunctivae are normal. Right eye exhibits no discharge. Left eye exhibits no discharge. No scleral icterus.  Neck: Normal range of motion. No JVD present. No tracheal  deviation present.  Pulmonary/Chest: Effort normal. No stridor.  Abdominal: Soft. She exhibits no distension and no mass. There is no tenderness. There is no rebound and no guarding. No hernia.  Neurological: She is alert and oriented to person, place, and time. Coordination normal.  Psychiatric: She has a normal mood and affect. Her behavior is normal. Judgment and thought content normal.  Nursing note and vitals reviewed.   ED Treatments / Results  Labs (all labs ordered are listed, but only abnormal results are displayed) Labs Reviewed  COMPREHENSIVE METABOLIC PANEL - Abnormal; Notable for the following components:      Result Value   Glucose, Bld 141 (*)    All other components within normal limits  URINALYSIS, ROUTINE W REFLEX MICROSCOPIC - Abnormal; Notable for the following components:   Color, Urine AMBER (*)    APPearance CLOUDY (*)    Hgb urine dipstick LARGE (*)    Protein, ur 30 (*)    RBC / HPF >50 (*)    Bacteria, UA FEW (*)    All other components within normal limits  LIPASE, BLOOD  CBC  I-STAT BETA HCG BLOOD, ED ([facility], [facility], [facility] ONLY)    EKG None  Radiology Ct Renal Stone Study  Result Date: 05/19/2018 CLINICAL DATA:  Right lower quadrant and right flank pain since yesterday. EXAM: CT ABDOMEN AND PELVIS WITHOUT CONTRAST TECHNIQUE: Multidetector CT imaging of the abdomen and pelvis was performed following the standard protocol without IV contrast. COMPARISON:  None. FINDINGS: Lower chest: Unremarkable Hepatobiliary: The liver shows diffusely decreased attenuation suggesting steatosis. There is no evidence for gallstones, gallbladder wall thickening, or pericholecystic fluid. No intrahepatic or extrahepatic biliary dilation. Pancreas: No focal mass lesion. No dilatation of the main duct. No intraparenchymal cyst. No peripancreatic edema. Spleen: No splenomegaly. No focal mass lesion. Adrenals/Urinary Tract: No adrenal nodule or mass. No right renal stones. Subtle right 4  x 4 x 5 mm stone along the right pelvic sidewall is probably in the distal right ureter, just distal to the iliac vessels. Periureteric edema is noted proximally. No left renal or ureteral stones. No secondary changes in the left kidney or ureter. No bladder stones. Stomach/Bowel: Stomach is nondistended. No gastric wall thickening. No evidence of outlet obstruction. Duodenum is normally positioned as is the ligament of Treitz. No small bowel wall thickening. No small bowel dilatation. The terminal ileum is normal. The appendix is normal. No gross colonic mass. No colonic wall thickening. No substantial diverticular change. Vascular/Lymphatic: No abdominal aortic aneurysm. No abdominal aortic atherosclerotic calcification. There is no gastrohepatic or hepatoduodenal ligament lymphadenopathy. No intraperitoneal or retroperitoneal lymphadenopathy. No pelvic sidewall lymphadenopathy. Reproductive: The uterus has normal CT imaging appearance. There is no adnexal mass. Other: No intraperitoneal free fluid. Musculoskeletal: No worrisome lytic or sclerotic osseous abnormality. Status post lower lumbar fusion. IMPRESSION: 1. 4  x 5 x 4 mm stone in the distal right ureter with associated mild periureteric edema. No substantial hydroureteronephrosis. No other urinary stone disease evident. Electronically Signed   By: Kennith CenterEric  Mansell M.D.   On: 05/19/2018 16:00    Procedures Procedures (including critical care time)  Medications Ordered in ED Medications  ondansetron (ZOFRAN-ODT) disintegrating tablet 4 mg (4 mg Oral Given 05/19/18 1317)  ketorolac (TORADOL) 30 MG/ML injection 30 mg (30 mg Intramuscular Given 05/19/18 1357)  oxyCODONE-acetaminophen (PERCOCET/ROXICET) 5-325 MG per tablet 1 tablet (1 tablet Oral Given 05/19/18 1655)     Initial Impression / Assessment and Plan / ED Course  I have reviewed the triage vital signs and the nursing notes.  Pertinent labs & imaging results that were available during my care  of the patient were reviewed by me and considered in my medical decision making (see chart for details).     Labs: urinalysis, I-STAT beta hCG, lipase, CBC, CMP  Imaging: CT renal  Consults:  Therapeutics: Toradol, Percocet  Discharge Meds: Toradol, Percocet, Flomax  Assessment/Plan: 52 year old female presents today with kidney stone. No comp kidding features here, well-appearing in no acute distress after pain management. She was discharged with outpatient urology follow-up and strict return precautions P Richard verbalized understanding and agreement to today's plan.    Final Clinical Impressions(s) / ED Diagnoses   Final diagnoses:  Kidney stone    ED Discharge Orders         Ordered    ketorolac (TORADOL) 10 MG tablet  Every 6 hours PRN     05/19/18 1641    oxyCODONE-acetaminophen (PERCOCET/ROXICET) 5-325 MG tablet  Every 6 hours PRN,   Status:  Discontinued     05/19/18 1641    tamsulosin (FLOMAX) 0.4 MG CAPS capsule  Daily,   Status:  Discontinued     05/19/18 1641    oxyCODONE-acetaminophen (PERCOCET/ROXICET) 5-325 MG tablet  Every 6 hours PRN     05/19/18 1642    tamsulosin (FLOMAX) 0.4 MG CAPS capsule  Daily     05/19/18 1649           Eyvonne MechanicHedges, Orlanda Lemmerman, PA-C 05/19/18 1657    Long, Arlyss RepressJoshua G, MD 05/20/18 [phone]

## 2018-05-19 NOTE — Discharge Instructions (Addendum)
Please read attached information. If you experience any new or worsening signs or symptoms please return to the emergency room for evaluation. Please follow-up with your primary care provider or specialist as discussed. Please use medication prescribed only as directed and discontinue taking if you have any concerning signs or symptoms.   °

## 2018-05-19 NOTE — ED Provider Notes (Signed)
Patient placed in Quick Look pathway, seen and evaluated   Chief Complaint: nausea and vomiting  HPI:   Sharon Robbins is a 52 y.o. female who presents to the ED with n/v that started at work about one hour prior to arrival to the ED. Patient reports she had diarrhea for the past 2 days but none today. Patient took ibuprofen for abdominal pain without relief.   ROS: GI: n/v, abdominal pain  Physical Exam:  BP (!) 138/98 (BP Location: Right Arm)   Pulse (!) 57   Temp 97.8 F (36.6 C) (Oral)   Resp 20   LMP 10/04/2014   SpO2 100%    Gen: No distress, appears uncomfortable, vomiting in triage  Neuro: Awake and Alert  Skin: Warm and dry  Abdomen: soft, tender with palpation RUQ, RLQ      Initiation of care has begun. The patient has been counseled on the process, plan, and necessity for staying for the completion/evaluation, and the remainder of the medical screening examination    [name], Hope M, NP 05/19/18 1320    [name], Scott, MD 05/21/18 [phone]

## 2018-05-25 ENCOUNTER — Other Ambulatory Visit: Payer: Self-pay | Admitting: Urology

## 2018-05-30 NOTE — H&P (Signed)
H&P  Chief Complaint: Kidney stone  History of Present Illness: Benjie KarvonenSuzanne M Puchalski is a 52 y.o. year old female who presents for ESL of a 5 mm right distal ureteral stone.  Past Medical History:  Diagnosis Date  . Headache     Past Surgical History:  Procedure Laterality Date  . CESAREAN SECTION      Home Medications:  No medications prior to admission.    Allergies: No Known Allergies  Family History  Problem Relation Age of Onset  . Lupus Mother   . Cancer Father   . Cancer Maternal Grandmother   . Stroke Paternal Grandmother     Social History:  reports that she has quit smoking. She has never used smokeless tobacco. She reports that she drinks alcohol. Her drug history is not on file.  ROS: A complete review of systems was performed.  All systems are negative except for pertinent findings as noted.  Physical Exam:  Vital signs in last 24 hours:   General:  Alert and oriented, No acute distress HEENT: Normocephalic, atraumatic Neck: No JVD or lymphadenopathy Cardiovascular: Regular rate and rhythm Lungs: Clear bilaterally Abdomen: Soft, nontender, nondistended, no abdominal masses Back: No CVA tenderness Extremities: No edema Neurologic: Grossly intact  Laboratory Data:  No results found for this or any previous visit (from the past 24 hour(s)). No results found for this or any previous visit (from the past 240 hour(s)). Creatinine: No results for input(s): CREATININE in the last 168 hours.  Radiologic Imaging: No results found.  Impression/Assessment:  5 mm right distal ureteral stone.  Plan:  ESL  Bertram MillardStephen M Rondale Nies 05/30/2018, 8:11 PM  Bertram MillardStephen M. Michala Deblanc MD

## 2018-05-31 ENCOUNTER — Other Ambulatory Visit: Payer: Self-pay

## 2018-05-31 ENCOUNTER — Encounter (HOSPITAL_COMMUNITY): Payer: Self-pay | Admitting: *Deleted

## 2018-05-31 ENCOUNTER — Ambulatory Visit (HOSPITAL_COMMUNITY)
Admission: RE | Admit: 2018-05-31 | Discharge: 2018-05-31 | Disposition: A | Discharge status: Home / Self Care | Payer: 59 | Source: Other Acute Inpatient Hospital | Attending: Urology | Admitting: Urology

## 2018-05-31 ENCOUNTER — Encounter (HOSPITAL_COMMUNITY)
Admission: RE | Disposition: A | Discharge status: Home / Self Care | Payer: Self-pay | Source: Other Acute Inpatient Hospital | Attending: Urology

## 2018-05-31 ENCOUNTER — Ambulatory Visit (HOSPITAL_COMMUNITY): Payer: 59

## 2018-05-31 DIAGNOSIS — Z87891 Personal history of nicotine dependence: Secondary | ICD-10-CM | POA: Insufficient documentation

## 2018-05-31 DIAGNOSIS — Z6833 Body mass index (BMI) 33.0-33.9, adult: Secondary | ICD-10-CM | POA: Insufficient documentation

## 2018-05-31 DIAGNOSIS — N201 Calculus of ureter: Secondary | ICD-10-CM | POA: Diagnosis present

## 2018-05-31 DIAGNOSIS — E669 Obesity, unspecified: Secondary | ICD-10-CM | POA: Insufficient documentation

## 2018-05-31 HISTORY — DX: Personal history of urinary calculi: Z87.442

## 2018-05-31 HISTORY — PX: EXTRACORPOREAL SHOCK WAVE LITHOTRIPSY: SHX1557

## 2018-05-31 SURGERY — LITHOTRIPSY, ESWL
Anesthesia: LOCAL | Laterality: Right

## 2018-05-31 MED ORDER — DIAZEPAM 5 MG PO TABS
10.0000 mg | ORAL_TABLET | ORAL | Status: AC
  Administered 2018-05-31: 10 mg via ORAL
  Filled 2018-05-31: qty 2

## 2018-05-31 MED ORDER — CIPROFLOXACIN HCL 500 MG PO TABS
500.0000 mg | ORAL_TABLET | ORAL | Status: AC
  Administered 2018-05-31: 500 mg via ORAL
  Filled 2018-05-31: qty 1

## 2018-05-31 MED ORDER — SODIUM CHLORIDE 0.9 % IV SOLN
INTRAVENOUS | Status: DC
  Administered 2018-05-31: 07:00:00 via INTRAVENOUS

## 2018-05-31 MED ORDER — DIPHENHYDRAMINE HCL 25 MG PO CAPS
25.0000 mg | ORAL_CAPSULE | ORAL | Status: AC
  Administered 2018-05-31: 25 mg via ORAL
  Filled 2018-05-31: qty 1

## 2018-05-31 SURGICAL SUPPLY — 2 items
COVER SURGICAL LIGHT HANDLE (MISCELLANEOUS) ×2 IMPLANT
TOWEL OR 17X26 10 PK STRL BLUE (TOWEL DISPOSABLE) ×2 IMPLANT

## 2018-05-31 NOTE — Op Note (Signed)
See [facility] OP note scanned into chart. 

## 2018-05-31 NOTE — Interval H&P Note (Signed)
History and Physical Interval Note:  05/31/2018 7:49 AM  Sharon Robbins  has presented today for surgery, with the diagnosis of RIGHT URETERAL STONE  The various methods of treatment have been discussed with the patient and family. After consideration of risks, benefits and other options for treatment, the patient has consented to  Procedure(s): RIGHT EXTRACORPOREAL SHOCK WAVE LITHOTRIPSY (ESWL) (Right) as a surgical intervention .  The patient's history has been reviewed, patient examined, no change in status, stable for surgery.  I have reviewed the patient's chart and labs.  Questions were answered to the patient's satisfaction.     Bertram MillardStephen M Roneka Gilpin

## 2018-05-31 NOTE — Discharge Instructions (Signed)
See [facility] discharge instructions in chart.  

## 2018-06-28 ENCOUNTER — Encounter (HOSPITAL_COMMUNITY): Payer: Self-pay | Admitting: Urology

## 2021-07-27 ENCOUNTER — Emergency Department (HOSPITAL_BASED_OUTPATIENT_CLINIC_OR_DEPARTMENT_OTHER)
Admission: EM | Admit: 2021-07-27 | Discharge: 2021-07-27 | Disposition: A | Discharge status: Home / Self Care | Payer: No Typology Code available for payment source | Attending: Emergency Medicine | Admitting: Emergency Medicine

## 2021-07-27 ENCOUNTER — Emergency Department (HOSPITAL_BASED_OUTPATIENT_CLINIC_OR_DEPARTMENT_OTHER): Payer: No Typology Code available for payment source

## 2021-07-27 ENCOUNTER — Other Ambulatory Visit: Payer: Self-pay

## 2021-07-27 ENCOUNTER — Encounter (HOSPITAL_BASED_OUTPATIENT_CLINIC_OR_DEPARTMENT_OTHER): Payer: Self-pay

## 2021-07-27 DIAGNOSIS — S8265XA Nondisplaced fracture of lateral malleolus of left fibula, initial encounter for closed fracture: Secondary | ICD-10-CM | POA: Diagnosis not present

## 2021-07-27 DIAGNOSIS — Y92194 Driveway of other specified residential institution as the place of occurrence of the external cause: Secondary | ICD-10-CM | POA: Insufficient documentation

## 2021-07-27 DIAGNOSIS — W010XXA Fall on same level from slipping, tripping and stumbling without subsequent striking against object, initial encounter: Secondary | ICD-10-CM | POA: Diagnosis not present

## 2021-07-27 DIAGNOSIS — Z79899 Other long term (current) drug therapy: Secondary | ICD-10-CM | POA: Insufficient documentation

## 2021-07-27 DIAGNOSIS — Y9301 Activity, walking, marching and hiking: Secondary | ICD-10-CM | POA: Insufficient documentation

## 2021-07-27 DIAGNOSIS — M25572 Pain in left ankle and joints of left foot: Secondary | ICD-10-CM | POA: Diagnosis present

## 2021-07-27 MED ORDER — OXYCODONE-ACETAMINOPHEN 5-325 MG PO TABS
1.0000 | ORAL_TABLET | Freq: Once | ORAL | Status: AC
  Administered 2021-07-27: 1 via ORAL
  Filled 2021-07-27: qty 1

## 2021-07-27 MED ORDER — OXYCODONE-ACETAMINOPHEN 5-325 MG PO TABS: 1.0000 | ORAL_TABLET | Freq: Four times a day (QID) | ORAL | 0 refills | Status: AC | PRN

## 2021-07-27 NOTE — Discharge Instructions (Signed)
You were seen and evaluated in the emergency department today for for evaluation of left ankle pain.  As we discussed, you broke your ankle in the outside.  I would like for you to follow-up with an orthopedist either your own personal one or our on-call that I gave you today.  Please use ice for the next 72 hours to reduce swelling.  Tylenol/ibuprofen for pain.  Percocet for breakthrough pain.  Please return to the emergency department if you experience worsening pain, severe swelling, discoloration of your foot and toes, soft tissue tightness in the calf or feet, or any other concerns you might have.

## 2021-07-27 NOTE — ED Triage Notes (Addendum)
Pt is present for a left ankle injury after she slipped going down her driveway on some acorns. Denies any other injuries. Pt heard a "pop" when she twisted her ankle. Unable to to bear weight due to pain. Took tylenol one hour PTA.

## 2021-07-27 NOTE — ED Provider Notes (Signed)
[facility] EMERGENCY DEPT Provider Note   CSN: [id number] Arrival date & time: 07/27/21  1849     History Chief Complaint  Patient presents with   Ankle Pain    Sharon Robbins is a 56 y.o. female who presents to the emergency department with left ankle pain after she tripped and fell in the driveway.  She states that she was walking on the drive when she slipped on some acorns twisted her ankle felt a popping sensation fell to the ground.  She has been nonweightbearing since.  Pain is localized to left lateral ankle and has been constant since onset she rates her pain moderate severity and is worse with minimal movement.  She denies any weakness to the foot, or any other injuries.   Ankle Pain     Past Medical History:  Diagnosis Date   Headache    History of kidney stones     Patient Active Problem List   Diagnosis Date Noted   Overweight(278.02) 02/15/2014    Past Surgical History:  Procedure Laterality Date   BACK SURGERY     CESAREAN SECTION     EXTRACORPOREAL SHOCK WAVE LITHOTRIPSY Right 05/31/2018   Procedure: RIGHT EXTRACORPOREAL SHOCK WAVE LITHOTRIPSY (ESWL);  Surgeon: Marcine Matar, MD;  Location: [facility];  Service: Urology;  Laterality: Right;     OB History   No obstetric history on file.     Family History  Problem Relation Age of Onset   Lupus Mother    Cancer Father    Cancer Maternal Grandmother    Stroke Paternal Grandmother     Social History   Tobacco Use   Smoking status: Former   Smokeless tobacco: Never  Building services engineer Use: Never used  Substance Use Topics   Alcohol use: Yes    Comment: 1-2 drinks on the weekend (special occasions)    Home Medications Prior to Admission medications   Medication Sig Start Date End Date Taking? Authorizing Provider  buPROPion (WELLBUTRIN) 100 MG tablet Take 100 mg by mouth every morning.   Yes [provider]  gabapentin (NEURONTIN) 100 MG capsule Take 100 mg by  mouth 2 (two) times daily.   Yes [provider]  clobetasol cream (TEMOVATE) 0.05 % Apply 1 application topically 2 (two) times daily.    [provider]  HYDROcodone-acetaminophen (NORCO/VICODIN) 5-325 MG per tablet Take 1-2 tablets by mouth every 6 (six) hours as needed for moderate pain.    [provider]  ibuprofen (ADVIL,MOTRIN) 200 MG tablet Take 400 mg by mouth every 6 (six) hours as needed for moderate pain.    [provider]  imipramine (TOFRANIL) 50 MG tablet Take 50 mg by mouth at bedtime.    [provider]  ketorolac (TORADOL) 10 MG tablet Take 1 tablet (10 mg total) by mouth every 6 (six) hours as needed. 05/19/18   Hedges, Tinnie Gens, PA-C  lubiprostone (AMITIZA) 24 MCG capsule Take 1 capsule (24 mcg total) by mouth 2 (two) times daily with a meal. Patient not taking: Reported on 12/22/2016 07/22/14   Muthersbaugh, Dahlia Client, PA-C  ondansetron (ZOFRAN) 4 MG tablet Take 4 mg by mouth every 8 (eight) hours as needed for nausea or vomiting.    [provider]  oxyCODONE (ROXICODONE) 5 MG immediate release tablet Take 1 tablet (5 mg total) by mouth every 4 (four) hours as needed for breakthrough pain. Patient not taking: Reported on 12/22/2016 07/22/14   Muthersbaugh, Dahlia Client, PA-C  oxyCODONE-acetaminophen (  PERCOCET/ROXICET) 5-325 MG tablet Take 1 tablet by mouth every 6 (six) hours as needed for severe pain. 05/19/18   Hedges, Tinnie Gens, PA-C  tamsulosin (FLOMAX) 0.4 MG CAPS capsule Take 1 capsule (0.4 mg total) by mouth daily. 05/19/18   Eyvonne Mechanic, PA-C    Allergies    Patient has no known allergies.  Review of Systems   Review of Systems  All other systems reviewed and are negative.  Physical Exam Updated Vital Signs BP 114/88 (BP Location: Right Arm)   Pulse 97   Temp 98.5 F (36.9 C) (Temporal)   Resp 18   Ht 5\' 3"  (1.6 m)   Wt 81.6 kg   LMP 10/04/2014   SpO2 98%   BMI 31.89 kg/m   Physical Exam Vitals and nursing  note reviewed.  Constitutional:      Appearance: Normal appearance.  HENT:     Head: Normocephalic and atraumatic.  Eyes:     General:        Right eye: No discharge.        Left eye: No discharge.     Conjunctiva/sclera: Conjunctivae normal.  Pulmonary:     Effort: Pulmonary effort is normal.  Musculoskeletal:     Comments: Left ankle is swollen.  Tenderness over the left lateral malleolus.  2+ dorsalis pedis pulse on the left.  She has normal range of motion in her toes. Unable to perform capillary refill as the patient's nails are painted. Subjective decreased sensation in the toes.  Compartments are soft and the calf and foot.  Skin:    General: Skin is warm and dry.     Findings: No rash.     Comments: Skin is normal over the left foot and ankle.  No obvious ecchymosis or discoloration.  Neurological:     General: No focal deficit present.     Mental Status: She is alert.  Psychiatric:        Mood and Affect: Mood normal.        Behavior: Behavior normal.    ED Results / Procedures / Treatments   Labs (all labs ordered are listed, but only abnormal results are displayed) Labs Reviewed - No data to display  EKG None  Radiology DG Ankle Complete Left  Result Date: 07/27/2021 CLINICAL DATA:  Status post trauma. EXAM: LEFT ANKLE COMPLETE - 3+ VIEW COMPARISON:  None. FINDINGS: Acute fracture deformity is seen involving the left lateral malleolus. There is no evidence of dislocation. Mild anterior and lateral soft tissue swelling is seen. IMPRESSION: Acute fracture of the left lateral malleolus. Electronically Signed   By: 07/29/2021 M.D.   On: 07/27/2021 20:03    Procedures Procedures   Medications Ordered in ED Medications  oxyCODONE-acetaminophen (PERCOCET/ROXICET) 5-325 MG per tablet 1 tablet (has no administration in time range)    ED Course  I have reviewed the triage vital signs and the nursing notes.  Pertinent labs & imaging results that were  available during my care of the patient were reviewed by me and considered in my medical decision making (see chart for details).    MDM Rules/Calculators/A&P                          Sharon Robbins is a 55 y.o. female who presents the emergency department for evaluation of left ankle pain.  Given the history and physical exam is concerning for sprain versus fracture.  Imaging showed a left lateral malleolus  ankle fracture.  I have given her 1 dose of Percocet for pain.  I will also give her a short course of Percocet to go home with.  We will place the patient in a cam boot and crutches with orthopedic follow-up.  Strict return precautions were given.  Again I have a low suspicion for compartment syndrome at this time.  She is safe for discharge.   Final Clinical Impression(s) / ED Diagnoses Final diagnoses:  Closed nondisplaced fracture of lateral malleolus of left fibula, initial encounter    Rx / DC Orders ED Discharge Orders     None        Jolyn Lent 07/27/21 2151    Pollyann Savoy, MD 07/27/21 [phone]

## 2021-10-10 ENCOUNTER — Other Ambulatory Visit: Payer: Self-pay

## 2021-10-10 ENCOUNTER — Ambulatory Visit: Payer: No Typology Code available for payment source | Attending: Orthopedic Surgery | Admitting: Physical Therapy

## 2021-10-10 ENCOUNTER — Encounter: Payer: Self-pay | Admitting: Physical Therapy

## 2021-10-10 DIAGNOSIS — M25572 Pain in left ankle and joints of left foot: Secondary | ICD-10-CM | POA: Insufficient documentation

## 2021-10-10 DIAGNOSIS — M25672 Stiffness of left ankle, not elsewhere classified: Secondary | ICD-10-CM | POA: Insufficient documentation

## 2021-10-10 DIAGNOSIS — M6281 Muscle weakness (generalized): Secondary | ICD-10-CM | POA: Diagnosis not present

## 2021-10-10 DIAGNOSIS — M5442 Lumbago with sciatica, left side: Secondary | ICD-10-CM | POA: Diagnosis not present

## 2021-10-10 DIAGNOSIS — R2689 Other abnormalities of gait and mobility: Secondary | ICD-10-CM | POA: Diagnosis not present

## 2021-10-10 NOTE — Therapy (Signed)
[facility] @ [facility] [address] [place], [place], [address] Phone: [phone]   Fax:  [phone]  Physical Therapy Evaluation  Patient Details  Name: Sharon Robbins MRN: [id number] Date of Birth: 05-05-1966 Referring Provider (PT): Montez Morita, PA-C   Encounter Date: 10/10/2021   PT End of Session - 10/10/21 1510     Visit Number 1    Date for PT Re-Evaluation 11/22/21    Authorization Type [employer] [place] preferred    Authorization Time Period 10/10/21 to 11/22/21    PT Start Time 1155    PT Stop Time 1233    PT Time Calculation (min) 38 min    Activity Tolerance Patient tolerated treatment well    Behavior During Therapy [facility] for tasks assessed/performed             Past Medical History:  Diagnosis Date   Headache    History of kidney stones     Past Surgical History:  Procedure Laterality Date   BACK SURGERY     CESAREAN SECTION     EXTRACORPOREAL SHOCK WAVE LITHOTRIPSY Right 05/31/2018   Procedure: RIGHT EXTRACORPOREAL SHOCK WAVE LITHOTRIPSY (ESWL);  Surgeon: Marcine Matar, MD;  Location: [facility];  Service: Urology;  Laterality: Right;    There were no vitals filed for this visit.    Subjective Assessment - 10/10/21 1159     Subjective Pt broke her distal fibula on 07/27/21 and wore a boot for about 2 months. Her Lt ankle/foot feels uncomfortable when walking around without support on the foot but she is recently started some exercises from the MD to help strengthen her ankle. At approximately 3 weeks into wearing her boot, she noticed Lt LE sciatica pain and N/T which resolved a week later. She continues to have some numbness down the leg (primarily the knee down to the foot) which has not fully resolved. This does not seem to be improving and she is unsure of anything that worsens or improves it.    Pertinent History Lt ankle fx, lumbar L4/L5 surgery in 2016    How long can you walk comfortably? no more than an  hour    Patient Stated Goals improve comfort in the foot and decrease the numbness in the Lt leg    Currently in Pain? No/denies   N/T posterior Lt thigh down to the outer foot               [facility] PT Assessment - 10/10/21 0001       Assessment   Medical Diagnosis Lt fibula fx, Lt sciatica    Referring Provider (PT) Montez Morita, PA-C    Onset Date/Surgical Date 07/27/21    Next MD Visit next week    Prior Therapy none      Precautions   Precautions None      Restrictions   Weight Bearing Restrictions No   WBAT     Balance Screen   Has the patient fallen in the past 6 months No    Has the patient had a decrease in activity level because of a fear of falling?  No    Is the patient reluctant to leave their home because of a fear of falling?  No      Home Environment   Living Environment Private residence      Cognition   Overall Cognitive Status Within Functional Limits for tasks assessed      Observation/Other Assessments   Observations mild swelling  noted around Lt lateral maleolus      Sensation   Additional Comments numbness/tingling reported posterior Lt thigh down the back of the calf to the side of the foot      ROM / Strength   AROM / PROM / Strength AROM;Strength      AROM   Overall AROM Comments Lt ankle: inversion 20 deg, Dorsiflexion 0 deg (knee extended), 5 deg (knee flexed)      Strength   Overall Strength Comments difficulty with Lt single leg heel raises, raising up on outside of foot      Special Tests   Other special tests (+) slump on Lt                        Objective measurements completed on examination: See above findings.       [facility] Adult PT Treatment/Exercise - 10/10/21 0001       Exercises   Exercises Other Exercises    Other Exercises  Lt LE nerve flossing in supine- HEP demo                     PT Education - 10/10/21 1509     Education Details eval findings/POC; answered pt questions/concerns;  supine nerve flossing Lt only    Person(s) Educated Patient    Methods Explanation    Comprehension Verbalized understanding              PT Short Term Goals - 10/10/21 1512       PT SHORT TERM GOAL #1   Title Pt will be independent with her inititial HEP    Time 3    Period Weeks    Status New               PT Long Term Goals - 10/10/21 1513       PT LONG TERM GOAL #1   Title Pt will have atleast 10 deg of active DF on the Lt with the knee extended.    Time 6    Period Weeks    Status New      PT LONG TERM GOAL #2   Title Pt will report atleast 50% improvement in Lt ankle pain/stability from the start of PT.    Time 6    Period Weeks    Status New      PT LONG TERM GOAL #3   Title Pt will report atleast 40% improvement in Lt LE numbness/tingling from the start of PT.    Time 6    Period Weeks    Status New      PT LONG TERM GOAL #4   Title Pt will be able to complete single leg stance on unstable surface atleast 5 sec, 2/3 trials each side to demonstrate improved ankle proprioception.    Time 6    Period Weeks    Status New      PT LONG TERM GOAL #5   Title Pt will have negative slump test on the Lt to reflect improvements in neural tension and mobility.    Time 6    Period Weeks    Status New                    Plan - 10/10/21 1522     Clinical Impression Statement Pt is a 56 y.o F referred to OPPT following Lt distal fibular fracture on 07/27/21. She is now out of the  boot for several weeks and has noticed some discomfort and unsteadiness when standing/walking without ankle support. Pt also noted Lt sciatica shortly after breaking her ankle. Since the onset, her pain has subsided, but she continues to experience numbness/tingling in the S1 distribution of the Lt LE. For the Lt ankle, she has limited active and passive ROM, which is expected following immobilization for 2 months. She also has significant weakness of the gastric noted when  attempting single leg heel raises. Pt has a positive slump test on the Lt and was instructed in a nerve flossing exercise for home to improve neural mobility in the lower leg. Pt would benefit from skilled PT to address her limitations in ankle ROM, strength and proprioception as well as continue to assess and treat her Lt LE sciatica symptoms in order to facilitate her safe return to prior level of activity.    Personal Factors and Comorbidities Past/Current Experience;Comorbidity 1    Comorbidities history of L4/L5 fusion per pt    Examination-Activity Limitations Stand;Locomotion Level    Stability/Clinical Decision Making Stable/Uncomplicated    Clinical Decision Making Moderate    Rehab Potential Good    PT Frequency 1x / week   due to high co-pay pt would like 1x/week   PT Duration 6 weeks    PT Treatment/Interventions ADLs/Self Care Home Management;Gait training;Stair training;Therapeutic activities;Therapeutic exercise;Balance training;Neuromuscular re-education;Manual techniques;Dry needling;Passive range of motion;Patient/family education;Taping;Cryotherapy    PT Next Visit Plan provide HEP (forgot to provide handout for nerve glide); ankle ROM and strength progression; ankle proprioception-taping ankle for additional stability; possible DN to low back    PT Home Exercise Plan next visit    Consulted and Agree with Plan of Care Patient             Patient will benefit from skilled therapeutic intervention in order to improve the following deficits and impairments:  Decreased range of motion, Increased muscle spasms, Decreased activity tolerance, Pain, Hypomobility, Improper body mechanics, Decreased balance, Decreased strength, Increased edema, Decreased mobility, Postural dysfunction, Impaired sensation  Visit Diagnosis: Pain in left ankle and joints of left foot  Stiffness of left ankle, not elsewhere classified  Left-sided low back pain with left-sided sciatica, unspecified  chronicity     Problem List Patient Active Problem List   Diagnosis Date Noted   Overweight(278.02) 02/15/2014   3:30 PM,10/10/21 Donita Brooks PT, DPT [facility]  [phone]   [facility] @ [facility] [address] [place], [place], [address] Phone: [phone]   Fax:  [phone]  Name: Sharon Robbins MRN: [id number] Date of Birth: 1966/03/26

## 2021-10-16 ENCOUNTER — Other Ambulatory Visit: Payer: Self-pay

## 2021-10-16 ENCOUNTER — Ambulatory Visit: Payer: No Typology Code available for payment source | Admitting: Physical Therapy

## 2021-10-16 DIAGNOSIS — M25572 Pain in left ankle and joints of left foot: Secondary | ICD-10-CM | POA: Diagnosis not present

## 2021-10-16 DIAGNOSIS — M25672 Stiffness of left ankle, not elsewhere classified: Secondary | ICD-10-CM

## 2021-10-16 NOTE — Therapy (Signed)
[facility] [address] [place], [place], [address] Phone: [phone]   Fax:  [phone]  Physical Therapy Treatment  Patient Details  Name: Sharon KarvonenSuzanne M Ballard MRN: [id number] Date of Birth: 09/24/1966 Referring Provider (PT): Montez MoritaKeith Paul, PA-C   Encounter Date: 10/16/2021   PT End of Session - 10/16/21 1233     Visit Number 2    Date for PT Re-Evaluation 11/22/21    Authorization Type [employer]ET[employer] [place] preferred    Authorization Time Period 10/10/21 to 11/22/21    PT Start Time 1105   PT checked pt in at 1010 but started session slightly before this at 1105   PT Stop Time 1144    PT Time Calculation (min) 39 min    Activity Tolerance Patient tolerated treatment well    Behavior During Therapy [facility] for tasks assessed/performed             Past Medical History:  Diagnosis Date   Headache    History of kidney stones     Past Surgical History:  Procedure Laterality Date   BACK SURGERY     CESAREAN SECTION     EXTRACORPOREAL SHOCK WAVE LITHOTRIPSY Right 05/31/2018   Procedure: RIGHT EXTRACORPOREAL SHOCK WAVE LITHOTRIPSY (ESWL);  Surgeon: Marcine Matarahlstedt, Stephen, MD;  Location: [facility];  Service: Urology;  Laterality: Right;    There were no vitals filed for this visit.   Subjective Assessment - 10/16/21 1110     Subjective Pt reports she is still having pain in Lt fibula and numbness but saw MD this morning and bone has healed now. No longer wearing boot.    Pertinent History Lt ankle fx, lumbar L4/L5 surgery in 2016    How long can you walk comfortably? no more than an hour    Patient Stated Goals improve comfort in the foot and decrease the numbness in the Lt leg    Currently in Pain? Yes    Pain Score 4     Pain Location Ankle    Pain Orientation Left;Lower;Anterior                               [facility] Adult PT Treatment/Exercise - 10/16/21 0001       Exercises   Exercises Knee/Hip;Lumbar;Other  Exercises    Other Exercises  Lt LE nerve flossing in supine- HEP demo      Lumbar Exercises: Stretches   Passive Hamstring Stretch Right;Left;2 reps;30 seconds    Single Knee to Chest Stretch Right;2 reps;30 seconds    Piriformis Stretch Right;Left;2 reps;30 seconds      Knee/Hip Exercises: Stretches   Theme park managerGastroc Stretch Both;2 reps;30 seconds      Knee/Hip Exercises: Standing   Heel Raises --    Functional Squat 10 reps    Functional Squat Limitations bodyweight    Stairs 2x4 steps without handrails for stepping down on Lt LE for improved mechanics and safety for home steps as pt was demonstrating partial hoping on RLE, improved with cues    Other Standing Knee Exercises forward and backward steping bil x10 each; lateral toe taps over hurdle x10 each leg                     PT Education - 10/16/21 1232     Education Details Pt educated on techniques for HEP given previously for holding band without need of additional person, step training, and  adding calf stretch for home for decreased cramping in Lt calf.    Person(s) Educated Patient    Methods Explanation;Demonstration;Tactile cues;Verbal cues;Handout    Comprehension Returned demonstration;Verbalized understanding              PT Short Term Goals - 10/10/21 1512       PT SHORT TERM GOAL #1   Title Pt will be independent with her inititial HEP    Time 3    Period Weeks    Status New               PT Long Term Goals - 10/10/21 1513       PT LONG TERM GOAL #1   Title Pt will have atleast 10 deg of active DF on the Lt with the knee extended.    Time 6    Period Weeks    Status New      PT LONG TERM GOAL #2   Title Pt will report atleast 50% improvement in Lt ankle pain/stability from the start of PT.    Time 6    Period Weeks    Status New      PT LONG TERM GOAL #3   Title Pt will report atleast 40% improvement in Lt LE numbness/tingling from the start of PT.    Time 6    Period Weeks     Status New      PT LONG TERM GOAL #4   Title Pt will be able to complete single leg stance on unstable surface atleast 5 sec, 2/3 trials each side to demonstrate improved ankle proprioception.    Time 6    Period Weeks    Status New      PT LONG TERM GOAL #5   Title Pt will have negative slump test on the Lt to reflect improvements in neural tension and mobility.    Time 6    Period Weeks    Status New                   Plan - 10/16/21 1234     Clinical Impression Statement Pt presents to clinic reporting some continued pain in Lt anterior lower fibula/ankle with weight bearing only mostly with prolonged walking or standing or at end of day but worse with toe off phase of gait on Lt LE. Pt session focused on strengthening in standing and with step training, and stretching at Lt LE. Pt tolerated well and denied additional questions at end ofsession. Pt continued to demonstrate need of PT for improved gait mechanics, stair training, hip and LE strengthening, stretching at LE.    Personal Factors and Comorbidities Past/Current Experience;Comorbidity 1    Comorbidities history of L4/L5 fusion per pt    Examination-Activity Limitations Stand;Locomotion Level    Stability/Clinical Decision Making Stable/Uncomplicated    Rehab Potential Good    PT Frequency 1x / week   due to high co-pay pt would like 1x/week   PT Duration 6 weeks    PT Treatment/Interventions ADLs/Self Care Home Management;Gait training;Stair training;Therapeutic activities;Therapeutic exercise;Balance training;Neuromuscular re-education;Manual techniques;Dry needling;Passive range of motion;Patient/family education;Taping;Cryotherapy    PT Next Visit Plan provide HEP (forgot to provide handout for nerve glide); ankle ROM and strength progression; ankle proprioception-taping ankle for additional stability; possible DN to low back    PT Home Exercise Plan next visit    Consulted and Agree with Plan of Care Patient  Patient will benefit from skilled therapeutic intervention in order to improve the following deficits and impairments:  Decreased range of motion, Increased muscle spasms, Decreased activity tolerance, Pain, Hypomobility, Improper body mechanics, Decreased balance, Decreased strength, Increased edema, Decreased mobility, Postural dysfunction, Impaired sensation  Visit Diagnosis: Stiffness of left ankle, not elsewhere classified  Pain in left ankle and joints of left foot     Problem List Patient Active Problem List   Diagnosis Date Noted   Overweight(278.02) 02/15/2014    Otelia Sergeant, PT, DPT 10/16/2310:45 PM   [facility] @ [facility] [address] [place], [place], [address] Phone: [phone]   Fax:  [phone]  Name: CHASTY RANDAL MRN: [id number] Date of Birth: Jan 20, 1966

## 2021-10-18 ENCOUNTER — Other Ambulatory Visit: Payer: Self-pay | Admitting: Orthopedic Surgery

## 2021-10-18 DIAGNOSIS — M5416 Radiculopathy, lumbar region: Secondary | ICD-10-CM

## 2021-10-23 ENCOUNTER — Encounter: Payer: No Typology Code available for payment source | Admitting: Physical Therapy

## 2021-10-24 ENCOUNTER — Encounter: Payer: Self-pay | Admitting: Physical Therapy

## 2021-10-24 ENCOUNTER — Ambulatory Visit: Payer: No Typology Code available for payment source | Admitting: Physical Therapy

## 2021-10-24 ENCOUNTER — Other Ambulatory Visit: Payer: Self-pay

## 2021-10-24 DIAGNOSIS — M5442 Lumbago with sciatica, left side: Secondary | ICD-10-CM

## 2021-10-24 DIAGNOSIS — M25572 Pain in left ankle and joints of left foot: Secondary | ICD-10-CM | POA: Diagnosis not present

## 2021-10-24 DIAGNOSIS — M25672 Stiffness of left ankle, not elsewhere classified: Secondary | ICD-10-CM

## 2021-10-24 NOTE — Patient Instructions (Addendum)
Access Code: [email or web address] URL: [email or web address] Date: 10/24/2021 Prepared by: Loistine Simas Maddax Palinkas  Exercises Standing Ankle Dorsiflexion Stretch - 1 x daily - 7 x weekly - 1 sets - 3 reps - 20 hold Gastroc Stretch on Wall - 1 x daily - 7 x weekly - 2 sets - 3 reps - 20 hold Half Kneel Ankle Dorsiflexion Self-Mobilization - 1 x daily - 7 x weekly - 2 sets - 10 reps - 10 hold  TENNIS BALL RELEASE TO CALF

## 2021-10-24 NOTE — Therapy (Signed)
[facility] @ [facility] [address] [place], [place], [address] Phone: [phone]   Fax:  [phone]  Physical Therapy Treatment  Patient Details  Name: Sharon Robbins MRN: [id number] Date of Birth: July 06, 1966 Referring Provider (PT): Montez Morita, PA-C   Encounter Date: 10/24/2021   PT End of Session - 10/24/21 1104     Visit Number 3    Date for PT Re-Evaluation 11/22/21    Authorization Type [employer]ET[employer] [place] preferred    Authorization Time Period 10/10/21 to 11/22/21    PT Start Time 0802    PT Stop Time 0848    PT Time Calculation (min) 46 min    Activity Tolerance Patient tolerated treatment well    Behavior During Therapy [facility] for tasks assessed/performed             Past Medical History:  Diagnosis Date   Headache    History of kidney stones     Past Surgical History:  Procedure Laterality Date   BACK SURGERY     CESAREAN SECTION     EXTRACORPOREAL SHOCK WAVE LITHOTRIPSY Right 05/31/2018   Procedure: RIGHT EXTRACORPOREAL SHOCK WAVE LITHOTRIPSY (ESWL);  Surgeon: Marcine Matar, MD;  Location: [facility];  Service: Urology;  Laterality: Right;    There were no vitals filed for this visit.   Subjective Assessment - 10/24/21 0934     Subjective Pt has fusion L4/5 and given onset of Lt LE pain/N/T we are doing lumbar MRI in about a week.  Sciatica comes and goes.  I am very tender and N/T posterior calf to foot and that is constant.  I'd say we wait on DN until we get MRI results.  the top of my ankle hurts when I try to walk heel/toe.    Pertinent History Lt ankle fx, lumbar L4/L5 surgery in 2016    How long can you walk comfortably? no more than an hour    Patient Stated Goals improve comfort in the foot and decrease the numbness in the Lt leg    Currently in Pain? Yes    Pain Score 5     Pain Location Ankle    Pain Orientation Left;Anterior    Pain Descriptors / Indicators Tightness;Sore;Dull;Throbbing    Pain  Radiating Towards sore and numb calf    Pain Onset More than a month ago    Pain Frequency Intermittent    Aggravating Factors  walking heel/toe                               [facility] Adult PT Treatment/Exercise - 10/24/21 0001       Self-Care   Self-Care Other Self-Care Comments    Other Self-Care Comments  tennis ball rolling for Lt calf med/lat gastroc      Exercises   Exercises Ankle      Knee/Hip Exercises: Stretches   Gastroc Stretch Left;2 reps;20 seconds    Gastroc Stretch Limitations runner's stretch against wall    Soleus Stretch 3 reps;20 seconds;Left    Soleus Stretch Limitations for Lt ankle DF stretch      Manual Therapy   Manual Therapy Joint mobilization;Soft tissue mobilization    Joint Mobilization end range ankle DF mobs to talocrural joint, eversion mobs to subtalar joint - seated and supine Gr III/IV    Soft tissue mobilization Lt med/lat gastroc, soleus, from prone      Ankle Exercises: Seated  ABC's 1 rep    ABC's Limitations emphasis on ankle DF and eversion ROM    Ankle Circles/Pumps Left;AROM    Ankle Circles/Pumps Limitations emphasis on ankle DF and eversion ROM    Other Seated Ankle Exercises self mobs Lt ankle DF with heel slide under chair, apply pressure through hands onto top of distal thigh, mob x 10, then hold x 10, repeat 3 rounds                     PT Education - 10/24/21 1016     Education Details Access Code: [email or web address]    Person(s) Educated Patient    Methods Explanation;Demonstration;Handout    Comprehension Verbalized understanding              PT Short Term Goals - 10/10/21 1512       PT SHORT TERM GOAL #1   Title Pt will be independent with her inititial HEP    Time 3    Period Weeks    Status New               PT Long Term Goals - 10/10/21 1513       PT LONG TERM GOAL #1   Title Pt will have atleast 10 deg of active DF on the Lt with the knee extended.    Time 6    Period  Weeks    Status New      PT LONG TERM GOAL #2   Title Pt will report atleast 50% improvement in Lt ankle pain/stability from the start of PT.    Time 6    Period Weeks    Status New      PT LONG TERM GOAL #3   Title Pt will report atleast 40% improvement in Lt LE numbness/tingling from the start of PT.    Time 6    Period Weeks    Status New      PT LONG TERM GOAL #4   Title Pt will be able to complete single leg stance on unstable surface atleast 5 sec, 2/3 trials each side to demonstrate improved ankle proprioception.    Time 6    Period Weeks    Status New      PT LONG TERM GOAL #5   Title Pt will have negative slump test on the Lt to reflect improvements in neural tension and mobility.    Time 6    Period Weeks    Status New                   Plan - 10/24/21 1328     Clinical Impression Statement Pt getting lumbar MRI in 1 week and wishes to hold off on addressing LBP/sciatic symptoms until results come in so she can focus on Lt ankle.  Pt presents with capsular pattern of limitation in Lt ankle with hypomobility in talocrural joint and subtalar joint.  She has tender TPs in lateral > medial gastroc.  Session focused on manual therapy for joint mobilization and STM for targeted mobility.  Reviewed HEP verbally and encouraged Pt to continue ankle circles and alphabet with emphasis on DF and eversion ranges within exercises.  Initiated Medbridge for ankle/calf stretches.  Continue along POC.    Comorbidities history of L4/L5 fusion per pt    PT Frequency 1x / week    PT Duration 6 weeks    PT Treatment/Interventions ADLs/Self Care Home Management;Gait training;Stair training;Therapeutic activities;Therapeutic exercise;Balance training;Neuromuscular re-education;Manual techniques;Dry  needling;Passive range of motion;Patient/family education;Taping;Cryotherapy    PT Next Visit Plan Pt may want to increase to 2x/week, continue Lt ankle joint mobs/stretching, intro  proprioception, gait, step downs, functional squats, heel raises, assess lumbar when MRI results are back    PT Home Exercise Plan Access Code: [email or web address]    Consulted and Agree with Plan of Care Patient             Patient will benefit from skilled therapeutic intervention in order to improve the following deficits and impairments:     Visit Diagnosis: Stiffness of left ankle, not elsewhere classified  Pain in left ankle and joints of left foot  Left-sided low back pain with left-sided sciatica, unspecified chronicity     Problem List Patient Active Problem List   Diagnosis Date Noted   Overweight(278.02) 02/15/2014    Morton Peters, PT 10/24/21 1:32 PM   [facility] @ [facility] [address] [place], [place], [address] Phone: [phone]   Fax:  [phone]  Name: Sharon Robbins MRN: [id number] Date of Birth: [birth year]

## 2021-10-30 ENCOUNTER — Ambulatory Visit: Payer: No Typology Code available for payment source | Admitting: Physical Therapy

## 2021-10-30 ENCOUNTER — Other Ambulatory Visit: Payer: Self-pay

## 2021-10-30 ENCOUNTER — Encounter: Payer: Self-pay | Admitting: Physical Therapy

## 2021-10-30 DIAGNOSIS — M25572 Pain in left ankle and joints of left foot: Secondary | ICD-10-CM

## 2021-10-30 DIAGNOSIS — M25672 Stiffness of left ankle, not elsewhere classified: Secondary | ICD-10-CM

## 2021-10-30 NOTE — Patient Instructions (Signed)
Access Code: [email or web address] URL: [email or web address] Date: 10/30/2021 Prepared by: Venetia Night Dniyah Grant  Exercises Standing Ankle Dorsiflexion Stretch - 1 x daily - 7 x weekly - 1 sets - 3 reps - 20 hold Gastroc Stretch on Wall - 1 x daily - 7 x weekly - 2 sets - 3 reps - 20 hold Half Kneel Ankle Dorsiflexion Self-Mobilization - 1 x daily - 7 x weekly - 2 sets - 10 reps - 10 hold Single Leg Stance with Support - 2 x daily - 7 x weekly - 3 sets - 20 reps Single Leg Balance with Opposite Leg Star Reach - 1 x daily - 7 x weekly - 3 sets - 10 reps Forward Step Down - 2 x daily - 7 x weekly - 2 sets - 10 reps

## 2021-10-30 NOTE — Therapy (Signed)
[facility] @ [place], [place], [address] Phone: [phone]   Fax:  [phone]  Physical Therapy Treatment  Patient Details  Name: Sharon Robbins MRN: [id number] Date of Birth: 15-Jul-1966 Referring Provider (PT): Ainsley Spinner, PA-C   Encounter Date: 10/30/2021   PT End of Session - 10/30/21 0856     Visit Number 4    Date for PT Re-Evaluation 11/22/21    Authorization Type [employer]ET[employer] [place] preferred    Authorization Time Period 10/10/21 to 11/22/21    PT Start Time 0804    PT Stop Time 0845    PT Time Calculation (min) 41 min    Activity Tolerance Patient tolerated treatment well    Behavior During Therapy [facility] for tasks assessed/performed             Past Medical History:  Diagnosis Date   Headache    History of kidney stones     Past Surgical History:  Procedure Laterality Date   BACK SURGERY     CESAREAN SECTION     EXTRACORPOREAL SHOCK WAVE LITHOTRIPSY Right 05/31/2018   Procedure: RIGHT EXTRACORPOREAL SHOCK WAVE LITHOTRIPSY (ESWL);  Surgeon: Franchot Gallo, MD;  Location: [facility];  Service: Urology;  Laterality: Right;    There were no vitals filed for this visit.   Subjective Assessment - 10/30/21 0807     Subjective I was very sore after last time.    Pertinent History Lt ankle fx, lumbar L4/L5 surgery in 2016    How long can you walk comfortably? no more than an hour    Patient Stated Goals improve comfort in the foot and decrease the numbness in the Lt leg    Currently in Pain? Yes    Pain Score 4     Pain Location Ankle    Pain Orientation Left    Pain Descriptors / Indicators Tightness;Sore;Throbbing    Pain Type Acute pain                               [facility] Adult PT Treatment/Exercise - 10/30/21 0001       Exercises   Exercises Ankle;Knee/Hip      Knee/Hip Exercises: Standing   Step Down Left;2 sets;10 reps;Step Height: 2";Step Height: 4";Hand Hold: 1     Step Down Limitations 1x10 each height    Gait Training focused ankle pronation and eversion in midstance, improved pain and pattern with VC and demo x 2 laps      Manual Therapy   Manual Therapy Joint mobilization    Joint Mobilization supine ankle DF and eversion mobs end range Gr III/IV      Ankle Exercises: Stretches   Slant Board Stretch 2 reps;30 seconds   Lt     Ankle Exercises: Standing   Rocker Board 1 minute   bil DF/PF   Heel Raises Limitations bil x 10 with UE assist      Ankle Exercises: Seated   BAPS Sitting;Level 1   DF/PF/inversion/eversion and circles broken into quadrants                    PT Education - 10/30/21 0833     Education Details added SLS, clock taps, step downs    Person(s) Educated Patient    Methods Explanation;Demonstration;Handout    Comprehension Verbalized understanding;Returned demonstration  PT Short Term Goals - 10/10/21 1512       PT SHORT TERM GOAL #1   Title Pt will be independent with her inititial HEP    Time 3    Period Weeks    Status New               PT Long Term Goals - 10/10/21 1513       PT LONG TERM GOAL #1   Title Pt will have atleast 10 deg of active DF on the Lt with the knee extended.    Time 6    Period Weeks    Status New      PT LONG TERM GOAL #2   Title Pt will report atleast 50% improvement in Lt ankle pain/stability from the start of PT.    Time 6    Period Weeks    Status New      PT LONG TERM GOAL #3   Title Pt will report atleast 40% improvement in Lt LE numbness/tingling from the start of PT.    Time 6    Period Weeks    Status New      PT LONG TERM GOAL #4   Title Pt will be able to complete single leg stance on unstable surface atleast 5 sec, 2/3 trials each side to demonstrate improved ankle proprioception.    Time 6    Period Weeks    Status New      PT LONG TERM GOAL #5   Title Pt will have negative slump test on the Lt to reflect improvements  in neural tension and mobility.    Time 6    Period Weeks    Status New                   Plan - 10/30/21 0857     Clinical Impression Statement Pt was sore following mobilization and is sore with HEP stretches.  PT added gait training for Lt eversion/prontation in midstance which improved gait pattern symmetry and reduced pain with gait today.  PT noted improved subtalar eversion mobility today.  Session progressed step down heel taps for functional ankle DF in closed chain  from 2" and 4" step, SLS with single UE support, and star taps on Lt SLS.  Pt had challenges with L1 BAPS board full circles so broke them down into focused quadrants of control.  Continue along POC.    Comorbidities history of L4/L5 fusion per pt    PT Frequency 1x / week    PT Duration 6 weeks    PT Treatment/Interventions ADLs/Self Care Home Management;Gait training;Stair training;Therapeutic activities;Therapeutic exercise;Balance training;Neuromuscular re-education;Manual techniques;Dry needling;Passive range of motion;Patient/family education;Taping;Cryotherapy    PT Next Visit Plan Pt may want to increase to 2x/week, continue Lt ankle joint mobs/stretching, intro proprioception, gait, step downs, functional squats, heel raises, assess lumbar when MRI results are back    PT Home Exercise Plan Access Code: [email or web address]    Consulted and Agree with Plan of Care Patient             Patient will benefit from skilled therapeutic intervention in order to improve the following deficits and impairments:     Visit Diagnosis: Stiffness of left ankle, not elsewhere classified  Pain in left ankle and joints of left foot     Problem List Patient Active Problem List   Diagnosis Date Noted   Overweight(278.02) 02/15/2014    Trystin Hargrove, PT 10/30/21 10:13 AM  [facility] @ [place], [place], [address] Phone: [phone]   Fax:   [phone]  Name: Sharon Robbins MRN: [id number] Date of Birth: June 16, 1966

## 2021-11-06 ENCOUNTER — Other Ambulatory Visit: Payer: Self-pay

## 2021-11-06 ENCOUNTER — Ambulatory Visit: Payer: No Typology Code available for payment source | Attending: Orthopedic Surgery | Admitting: Physical Therapy

## 2021-11-06 ENCOUNTER — Encounter: Payer: Self-pay | Admitting: Physical Therapy

## 2021-11-06 DIAGNOSIS — M25572 Pain in left ankle and joints of left foot: Secondary | ICD-10-CM | POA: Insufficient documentation

## 2021-11-06 DIAGNOSIS — M25672 Stiffness of left ankle, not elsewhere classified: Secondary | ICD-10-CM | POA: Diagnosis not present

## 2021-11-06 NOTE — Patient Instructions (Signed)
Access Code: [email or web address] URL: [email or web address] Date: 11/06/2021 Prepared by: Loistine Simas Yashas Camilli  Exercises Standing Ankle Dorsiflexion Stretch - 1 x daily - 7 x weekly - 1 sets - 3 reps - 20 hold Gastroc Stretch on Wall - 1 x daily - 7 x weekly - 2 sets - 3 reps - 20 hold Half Kneel Ankle Dorsiflexion Self-Mobilization - 1 x daily - 7 x weekly - 2 sets - 10 reps - 10 hold Single Leg Stance with Support - 2 x daily - 7 x weekly - 3 sets - 20 reps Single Leg Balance with Opposite Leg Star Reach - 1 x daily - 7 x weekly - 3 sets - 10 reps Forward Step Down - 2 x daily - 7 x weekly - 2 sets - 10 reps Seated Ankle Eversion with Resistance - 1 x daily - 7 x weekly - 2 sets - 15 reps Seated Ankle Dorsiflexion with Anchored Resistance - Leg Straight - 1 x daily - 7 x weekly - 2 sets - 15 reps Isometric Ankle Inversion - 1 x daily - 7 x weekly - 2 sets - 10 reps - 5 hold Seated Gastroc Stretch with Strap - 1 x daily - 7 x weekly - 1 sets - 2 reps - 30 hold

## 2021-11-06 NOTE — Therapy (Addendum)
[facility] @ [place], [place], [address] Phone: [phone]   Fax:  [phone]  Physical Therapy Treatment  Patient Details  Name: Sharon Robbins MRN: [id number] Date of Birth: 06-06-1966 Referring Provider (PT): Ainsley Spinner, PA-C   Encounter Date: 11/06/2021   PT End of Session - 11/06/21 0802     Visit Number 5    Date for PT Re-Evaluation 11/22/21    Authorization Type AETNA [place] preferred    Authorization Time Period 10/10/21 to 11/22/21    PT Start Time 0802    PT Stop Time 0846    PT Time Calculation (min) 44 min    Activity Tolerance Patient tolerated treatment well    Behavior During Therapy [facility] for tasks assessed/performed             Past Medical History:  Diagnosis Date   Headache    History of kidney stones     Past Surgical History:  Procedure Laterality Date   BACK SURGERY     CESAREAN SECTION     EXTRACORPOREAL SHOCK WAVE LITHOTRIPSY Right 05/31/2018   Procedure: RIGHT EXTRACORPOREAL SHOCK WAVE LITHOTRIPSY (ESWL);  Surgeon: Franchot Gallo, MD;  Location: [facility];  Service: Urology;  Laterality: Right;    There were no vitals filed for this visit.   Subjective Assessment - 11/06/21 0802     Subjective The ankle circles still feel stiff especially if I haven't been active.  It feels looser when I walk.    Pertinent History Lt ankle fx, lumbar L4/L5 surgery in 2016    How long can you walk comfortably? no more than an hour    Patient Stated Goals improve comfort in the foot and decrease the numbness in the Lt leg    Currently in Pain? Yes    Pain Score 2     Pain Location Ankle    Pain Orientation Left    Pain Descriptors / Indicators Tightness;Sore    Pain Type Acute pain    Pain Onset More than a month ago    Pain Frequency Intermittent    Aggravating Factors  being still makes it stiff, walking heel/toe                [facility] PT Assessment - 11/06/21 0001        Assessment   Medical Diagnosis Lt fibula fx, Lt sciatica    Referring Provider (PT) Ainsley Spinner, PA-C    Onset Date/Surgical Date 07/27/21    Next MD Visit next week    Prior Therapy none      AROM   AROM Assessment Site Ankle    Right/Left Ankle Left    Left Ankle Dorsiflexion 10    Left Ankle Plantar Flexion 35                           [facility] Adult PT Treatment/Exercise - 11/06/21 0001       Exercises   Exercises Ankle;Knee/Hip      Ankle Exercises: Stretches   Gastroc Stretch 1 rep;60 seconds   long sitting with strap     Ankle Exercises: Standing   SLS at mirror, add Rt leg swings fwd/bwd/Rt/Lt to fatigue, add Lt knee bend to star drill with less point of contact in middle with each tap    Heel Raises Limitations bil x 15, then bil up, Lt only down x 15, try several Lt single heel  raise      Ankle Exercises: Seated   Ankle Circles/Pumps 10 reps      Ankle Exercises: Supine   T-Band seated green tband 2x15: ankle DF, eversion    Other Supine Ankle Exercises seated inversion ball squeeze 10x5" holds                     PT Education - 11/06/21 0850     Education Details progressed ankle tband, heel raises, calf stretch with strap, options to challenge star drill and SLS    Person(s) Educated Patient    Methods Explanation;Demonstration;Handout    Comprehension Verbalized understanding;Returned demonstration              PT Short Term Goals - 10/10/21 1512       PT SHORT TERM GOAL #1   Title Pt will be independent with her inititial HEP    Time 3    Period Weeks    Status New               PT Long Term Goals - 10/10/21 1513       PT LONG TERM GOAL #1   Title Pt will have atleast 10 deg of active DF on the Lt with the knee extended.    Time 6    Period Weeks    Status New      PT LONG TERM GOAL #2   Title Pt will report atleast 50% improvement in Lt ankle pain/stability from the start of PT.    Time 6    Period  Weeks    Status New      PT LONG TERM GOAL #3   Title Pt will report atleast 40% improvement in Lt LE numbness/tingling from the start of PT.    Time 6    Period Weeks    Status New      PT LONG TERM GOAL #4   Title Pt will be able to complete single leg stance on unstable surface atleast 5 sec, 2/3 trials each side to demonstrate improved ankle proprioception.    Time 6    Period Weeks    Status New      PT LONG TERM GOAL #5   Title Pt will have negative slump test on the Lt to reflect improvements in neural tension and mobility.    Time 6    Period Weeks    Status New                   Plan - 11/06/21 3419     Clinical Impression Statement Pt now achieves 10 deg DF (from 0 deg at eval).  Due to financials, Pt would like to go 2 weeks on HEP and decide if she would like to return for ERO on 11/20/20 or d/c.  She will call with questions or to let us know readiness for d/c.  PT progressed HEP to further enhance strength, flexibility and balance on Lt LE.  Good tolerance and return of demo.    Comorbidities history of L4/L5 fusion per pt    PT Frequency 1x / week    PT Duration 6 weeks    PT Next Visit Plan return in 2 weeks for ERO    PT Home Exercise Plan Access Code: [email or web address]    Consulted and Agree with Plan of Care Patient             Patient will benefit from skilled therapeutic intervention in order  to improve the following deficits and impairments:     Visit Diagnosis: Stiffness of left ankle, not elsewhere classified  Pain in left ankle and joints of left foot     Problem List Patient Active Problem List   Diagnosis Date Noted   Overweight(278.02) 02/15/2014   Carnetta Losada, PT 11/06/21 9:32 AM  PHYSICAL THERAPY DISCHARGE SUMMARY  Visits from Start of Care: 5  Current functional level related to goals / functional outcomes: Pt concerned about finances/high-deductible so self-discharged.  Has HEP.   Remaining deficits: See above    Education / Equipment: HEP  Patient agrees to discharge. Patient goals were partially met. Patient is being discharged due to financial reasons.  Venetia Night Doyt Castellana, PT 11/19/21 1:19 PM  [facility] @ [place], [place], [address] Phone: [phone]   Fax:  [phone]  Name: ALESSIA GONSALEZ MRN: [id number] Date of Birth: Nov 22, 1965

## 2021-11-13 ENCOUNTER — Encounter: Payer: No Typology Code available for payment source | Admitting: Physical Therapy

## 2021-11-18 ENCOUNTER — Ambulatory Visit
Admission: RE | Admit: 2021-11-18 | Discharge: 2021-11-18 | Disposition: A | Discharge status: Home / Self Care | Payer: No Typology Code available for payment source | Source: Ambulatory Visit | Attending: Orthopedic Surgery | Admitting: Orthopedic Surgery

## 2021-11-18 ENCOUNTER — Other Ambulatory Visit: Payer: Self-pay

## 2021-11-18 DIAGNOSIS — M5416 Radiculopathy, lumbar region: Secondary | ICD-10-CM

## 2021-11-20 ENCOUNTER — Ambulatory Visit: Payer: No Typology Code available for payment source | Admitting: Physical Therapy

## 2021-11-27 ENCOUNTER — Encounter: Payer: No Typology Code available for payment source | Admitting: Physical Therapy

## 2021-12-19 ENCOUNTER — Telehealth: Payer: Self-pay | Admitting: Psychiatry

## 2021-12-19 NOTE — Telephone Encounter (Signed)
Spoke with patient to resch appt, explained overbook for 3/18, resch to 4/1 which is the soonest available. She will come in to [place] to fill out new patient paperwork. Patient stated that she should not have to be changed to another day and will be letting someone in the higher up know about this issue.  ?

## 2021-12-21 ENCOUNTER — Telehealth (HOSPITAL_COMMUNITY): Payer: Self-pay | Admitting: Psychiatry

## 2022-01-04 ENCOUNTER — Ambulatory Visit (HOSPITAL_COMMUNITY): Payer: Self-pay | Admitting: Psychiatry

## 2023-01-27 ENCOUNTER — Ambulatory Visit
Admission: RE | Admit: 2023-01-27 | Discharge: 2023-01-27 | Disposition: A | Discharge status: Home / Self Care | Payer: No Typology Code available for payment source | Source: Ambulatory Visit | Attending: Family Medicine | Admitting: Family Medicine

## 2023-01-27 ENCOUNTER — Other Ambulatory Visit: Payer: Self-pay | Admitting: Family Medicine

## 2023-01-27 DIAGNOSIS — R058 Other specified cough: Secondary | ICD-10-CM

## 2023-08-22 IMAGING — MR MR LUMBAR SPINE W/O CM
4 of 5 series · 26 of 48 positions shown · non-contrast
Comparison: MR lumbar 12/01/2014

CLINICAL DATA: Left leg pain radiates to foot with numbness for 2
months. History of surgery in 8263.

EXAM:
MRI LUMBAR SPINE WITHOUT CONTRAST
TECHNIQUE: Multiplanar, multisequence MR imaging of the lumbar spine was
performed. No intravenous contrast was administered.

[Series 2: T2 · sagittal · 4.0mm · 0.53mm/px · 5 of 13 slices shown (1 of 2)]
[im 1/13]
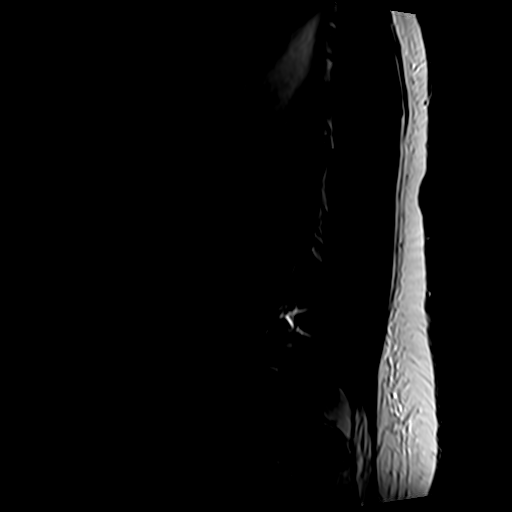
[im 4/13]
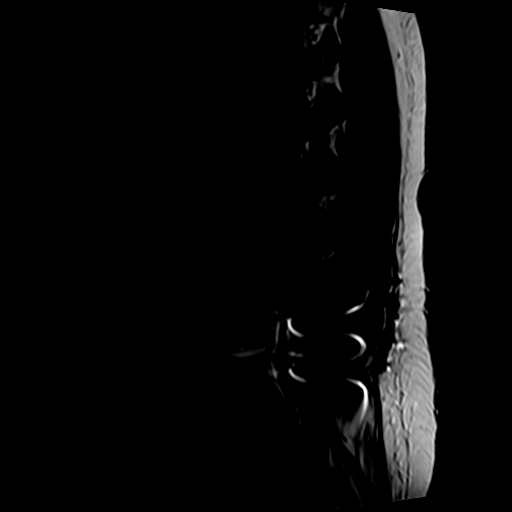
[im 7/13]
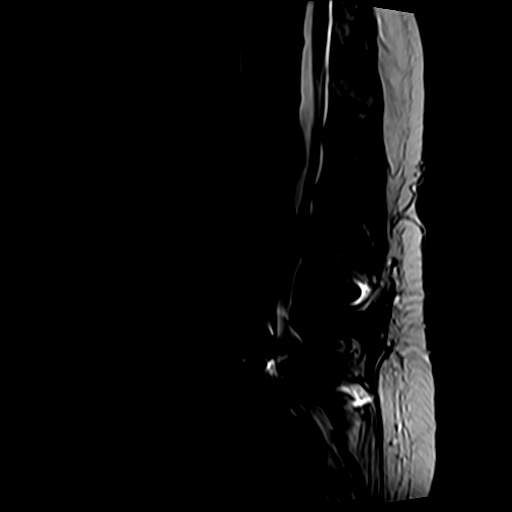
[im 10/13]
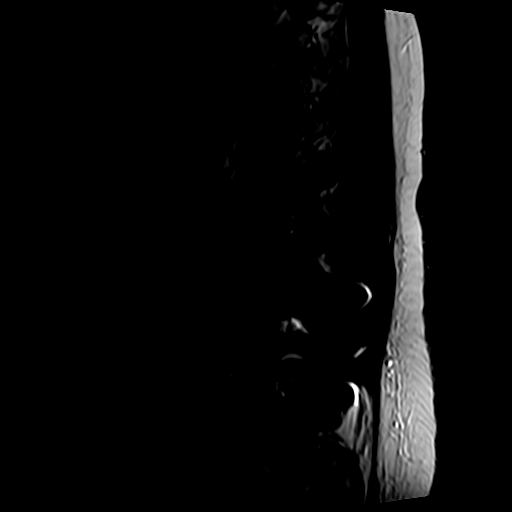
[im 13/13]
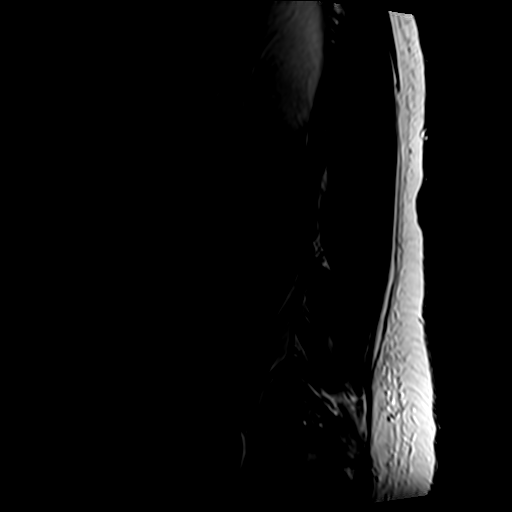

[Series 4: T1 · sagittal · 4.0mm · 0.53mm/px · 6 of 13 slices shown (1 of 2)]
[im 1/13]
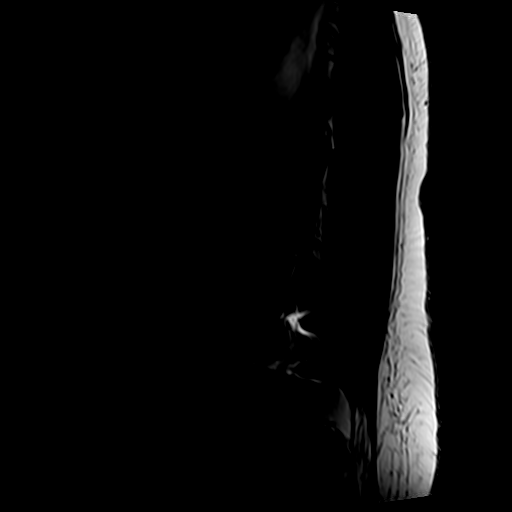
[im 3/13]
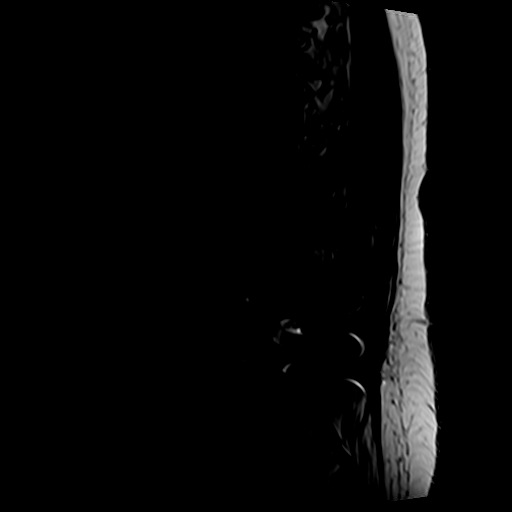
[im 5/13]
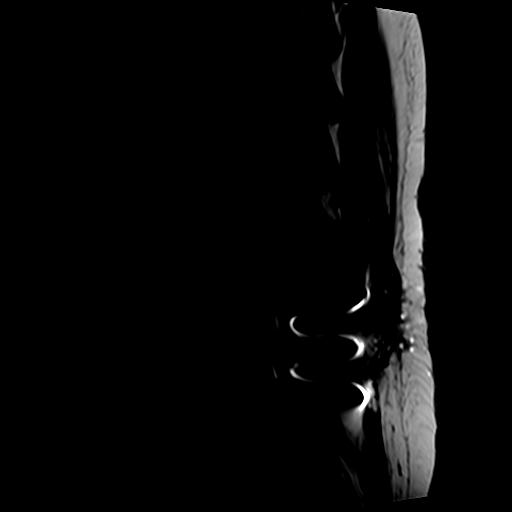
[im 8/13]
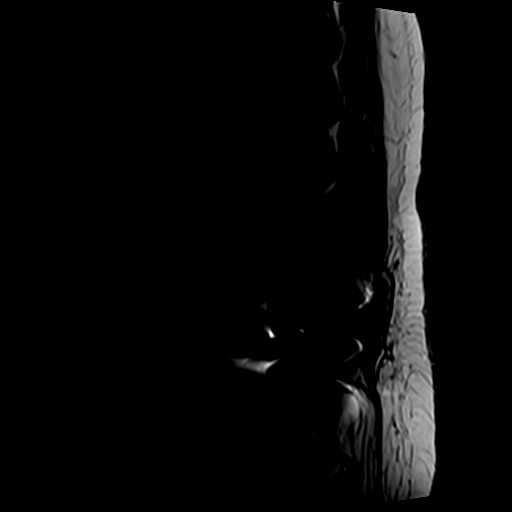
[im 10/13]
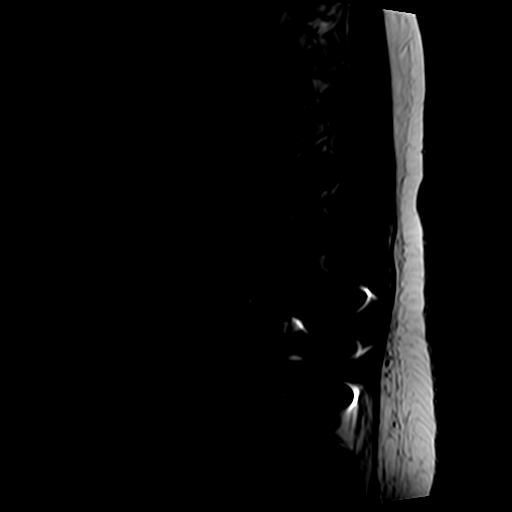
[im 13/13]
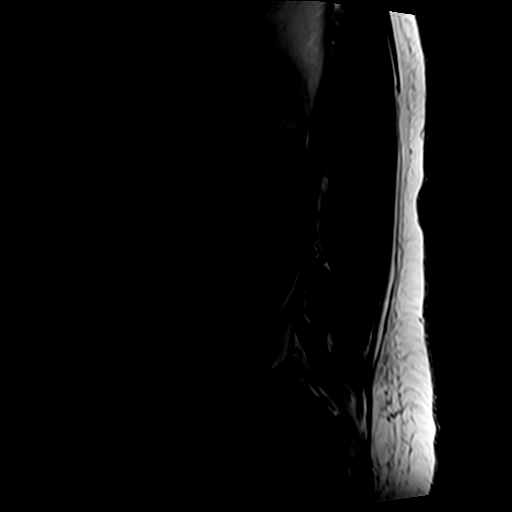

[Series 5: T2 · axial · 4.0mm · 0.70mm/px · z∈[-59,+137]mm · 10 of 37 slices shown (2 of 2)]
[im 3/37]
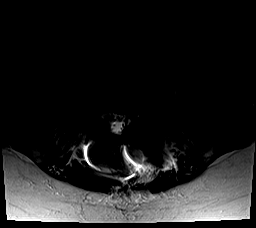
[im 5/37]
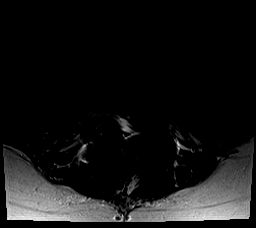
[im 8/37]
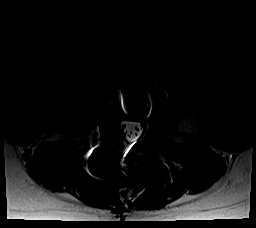
[im 13/37]
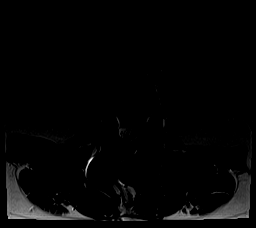
[im 17/37]
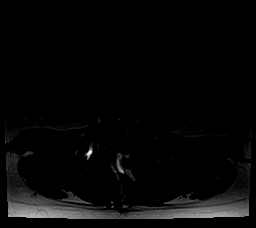
[im 20/37]
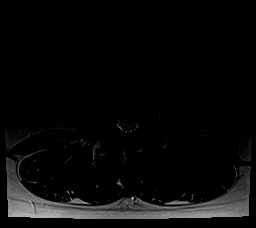
[im 22/37]
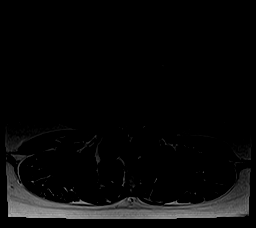
[im 27/37]
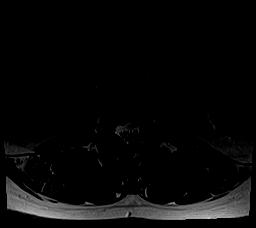
[im 32/37]
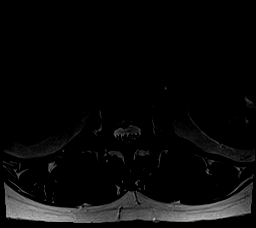
[im 37/37]
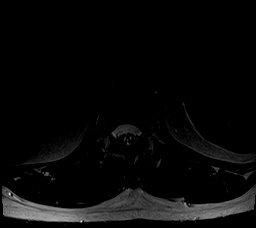

[Series 6: T1 · axial · 4.0mm · 0.35mm/px · z∈[-59,+111]mm · 5 of 37 slices shown (2 of 2)]
[im 3/37]
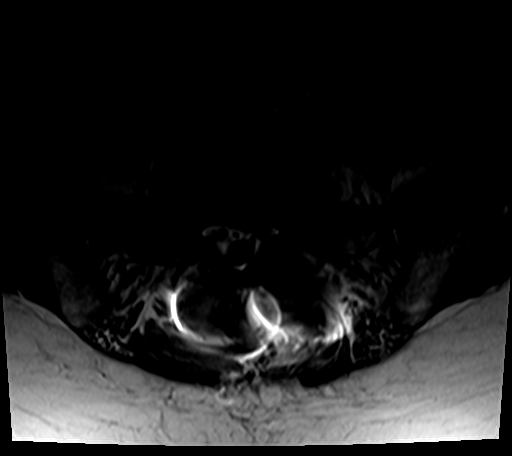
[im 5/37]
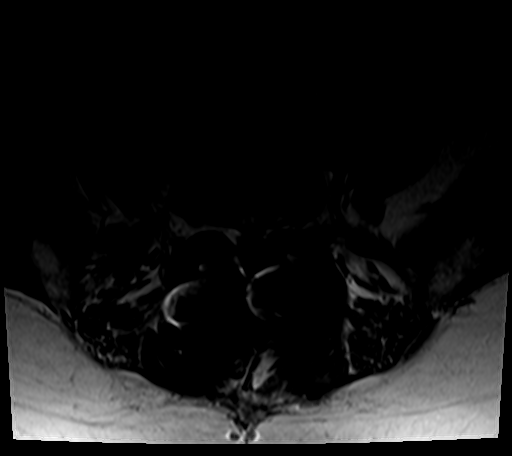
[im 8/37]
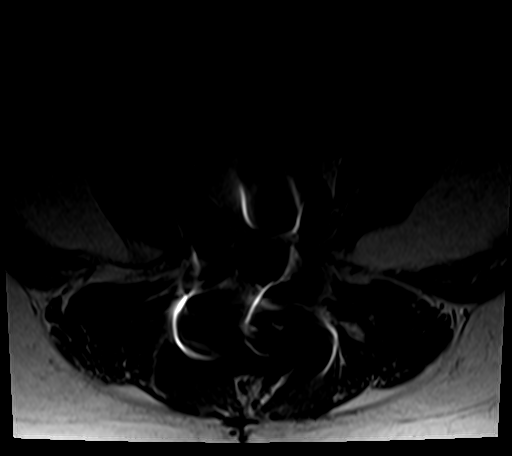
[im 20/37]
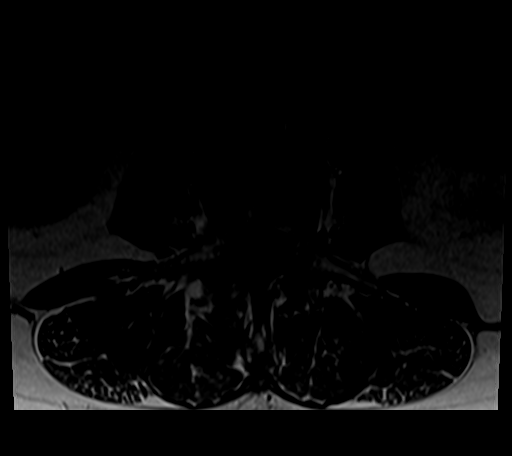
[im 32/37]
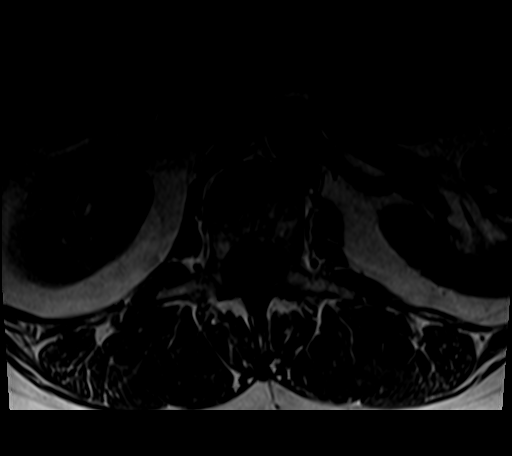

[26 of 48 positions shown; findings below may reference images not displayed]

FINDINGS: Segmentation:  Standard.

Alignment:  No significant listhesis.

Vertebrae: Postoperative changes at L4-L5 with rods and pedicle
screws and interbody spacer. Hardware is not well evaluated on this
study. There is associated susceptibility artifact. Similar
vertebral body heights. There is mild degenerative endplate
irregularity, primarily at L2-L3. Mild degenerative endplate marrow
edema at this level. No suspicious osseous lesion.

Conus medullaris and cauda equina: Conus extends to the T12 level.
Conus and cauda equina appear normal.

Paraspinal and other soft tissues: Chronic postoperative changes.

Disc levels:

L1-L2: Right central/subarticular extrusion extending above disc
level. No canal stenosis. Increased narrowing of the right
subarticular recess. No foraminal stenosis.

L2-L3: Disc bulge with endplate osteophytic ridging. Facet
arthropathy with ligamentum flavum infolding. Increased mild to
moderate canal stenosis. Partial effacement of subarticular
recesses. Minor right and no left foraminal stenosis.

L3-L4: Disc bulge with endplate osteophytic ridging eccentric to the
left. Facet arthropathy with ligamentum flavum infolding. Increased
moderate to marked canal stenosis. Narrowing of the left
subarticular recess. No right foraminal stenosis. Increased mild to
moderate left foraminal stenosis. Disc/osteophyte abuts the left L3
nerve root just beyond the foramen with potential compression.

L4-L5: Operative level. Canal appears decompressed. Distortion of
foramina by artifact without apparent stenosis.

L5-S1: Left subarticular/foraminal protrusion. Distortion of the
canal and left foramen by artifact. No canal stenosis. Increased
narrowing of the left subarticular recess. No right foraminal
stenosis. Probable mild to moderate left foraminal stenosis.
IMPRESSION: Postoperative degenerative changes as detailed above. Canal is
decompressed at L4-L5. Progression at other levels. Most notably,
there is increased moderate to marked canal stenosis at L3-L4 with
narrowing of the left subarticular recess. Disc/osteophyte also
abuts the left L3 nerve root beyond the foramen at this level with
potential compression. There is increased narrowing of the left
subarticular recess at L5-S1.

## 2024-06-28 ENCOUNTER — Ambulatory Visit: Attending: Orthopedic Surgery

## 2024-06-28 ENCOUNTER — Other Ambulatory Visit: Payer: Self-pay

## 2024-06-28 DIAGNOSIS — R262 Difficulty in walking, not elsewhere classified: Secondary | ICD-10-CM | POA: Insufficient documentation

## 2024-06-28 DIAGNOSIS — M25662 Stiffness of left knee, not elsewhere classified: Secondary | ICD-10-CM | POA: Insufficient documentation

## 2024-06-28 DIAGNOSIS — R252 Cramp and spasm: Secondary | ICD-10-CM | POA: Insufficient documentation

## 2024-06-28 DIAGNOSIS — M25562 Pain in left knee: Secondary | ICD-10-CM | POA: Insufficient documentation

## 2024-06-28 DIAGNOSIS — M6281 Muscle weakness (generalized): Secondary | ICD-10-CM | POA: Insufficient documentation

## 2024-06-28 NOTE — Therapy (Signed)
 OUTPATIENT PHYSICAL THERAPY LOWER EXTREMITY EVALUATION   Patient Name: Sharon Robbins MRN: [id number] DOB:[birth year], 58 y.o., female Today's Date: 06/28/2024  END OF SESSION:  PT End of Session - 06/28/24 1411     Visit Number 1    Date for Recertification  08/23/24    Authorization Type Aetna [place] Preferred    Authorization Time Period No auth required    PT Start Time 1406    PT Stop Time 1447    PT Time Calculation (min) 41 min    Activity Tolerance Patient tolerated treatment well    Behavior During Therapy WFL for tasks assessed/performed          Past Medical History:  Diagnosis Date   Headache    History of kidney stones    Past Surgical History:  Procedure Laterality Date   BACK SURGERY     CESAREAN SECTION     EXTRACORPOREAL SHOCK WAVE LITHOTRIPSY Right 05/31/2018   Procedure: RIGHT EXTRACORPOREAL SHOCK WAVE LITHOTRIPSY (ESWL);  Surgeon: Matilda Senior, MD;  Location: [facility];  Service: Urology;  Laterality: Right;   Patient Active Problem List   Diagnosis Date Noted   Overweight 02/15/2014    PCP: No PCP listed  REFERRING PROVIDER: Velinda Chancy, MD  REFERRING DIAG: Left anterior knee pain/IT band  THERAPY DIAG:  Acute pain of left knee  Stiffness of left knee, not elsewhere classified  Muscle weakness (generalized)  Difficulty in walking, not elsewhere classified  Cramp and spasm  Rationale for Evaluation and Treatment: Rehabilitation  ONSET DATE: 06/16/24  SUBJECTIVE:   SUBJECTIVE STATEMENT: Patient had meniscus repair 03/06/23 with Dr. Chancy.  (Back fusion 2016)  She did rehab and started playing pickle ball   PERTINENT HISTORY: meniscus repair 03/06/23 with Dr. Chancy.  (Back fusion 2016)  PAIN:  Are you having pain? Yes: NPRS scale: 0/10 currently, at worst initially 7/10, has taken a week off and its a lot better Pain location: lateral joint space Pain description: sharp/aching  Aggravating factors: pickle ball,  stairs Relieving factors: ice, ibuprofen  PRECAUTIONS: None  RED FLAGS: None   WEIGHT BEARING RESTRICTIONS: No  FALLS:  Has patient fallen in last 6 months? No  LIVING ENVIRONMENT: Lives with: lives with their spouse Lives in: House/apartment Stairs: No   OCCUPATION: desk work  PLOF: Independent, Independent with basic ADLs, Independent with household mobility without device, Independent with community mobility without device, Independent with homemaking with ambulation, Independent with gait, and Independent with transfers  PATIENT GOALS: To be able to continue playing pickle ball  NEXT MD VISIT: prn  OBJECTIVE:  Note: Objective measures were completed at Evaluation unless otherwise noted.  DIAGNOSTIC FINDINGS: Patient states   PATIENT SURVEYS:  LEFS  Extreme difficulty/unable (0), Quite a bit of difficulty (1), Moderate difficulty (2), Little difficulty (3), No difficulty (4) Survey date:  06/28/24  Score total:  50/80     COGNITION: Overall cognitive status: Within functional limits for tasks assessed     SENSATION: WFL  EDEMA:  Circumferential: Left: 40 cm, right : 39 cm  MUSCLE LENGTH: Hamstrings: Right 55 deg; Left 50 deg Thomas test: Right pos; Left pos  POSTURE: No Significant postural limitations  PALPATION: Milld crepitus left knee on open chain knee extension and flexion  LOWER EXTREMITY ROM:  WFL  LOWER EXTREMITY MMT:  Generally 4 to 4+/5 bilateral LE's  LOWER EXTREMITY SPECIAL TESTS:  Knee special tests: Held due to hx of menisectomy  FUNCTIONAL TESTS:  5 times sit to  stand: complete next visit Timed up and go (TUG): complete next visit  GAIT: Distance walked: 30 feet Assistive device utilized: None Level of assistance: Complete Independence Comments: antalgic start up                                                                                                                                TREATMENT DATE:  06/28/24   Initial eval completed and initiated HEP   PATIENT EDUCATION:  Education details: Initiated HEP Person educated: Patient Education method: Programmer, multimedia, Facilities manager, Verbal cues, and Handouts Education comprehension: verbalized understanding, returned demonstration, and verbal cues required  HOME EXERCISE PROGRAM: Access Code: [email or web address] URL: [email or web address] Date: 06/28/2024 Prepared by: Delon Haddock  Exercises - Supine ITB Stretch with Strap  - 1 x daily - 7 x weekly - 1 sets - 3 reps - 30 sec hold - Supine Piriformis Stretch with Foot on Ground  - 1 x daily - 7 x weekly - 1 sets - 3 reps - 30 sec hold - Sidelying IT Band Foam Roll Mobilization  - 1 x daily - 7 x weekly - 3 sets - 10 reps  ASSESSMENT:  CLINICAL IMPRESSION: Patient is a 58 y.o. female who was seen today for physical therapy evaluation and treatment for left knee pain.   She presents with normal ROM, slight edema mid patella, decreased quad strength, and elevated pain.  She would respond well to skilled PT for  quad rehab, knee and hip stabiity training, education on alignment and proper shoe wear.   OBJECTIVE IMPAIRMENTS: difficulty walking, decreased strength, increased edema, increased fascial restrictions, increased muscle spasms, impaired flexibility, postural dysfunction, and pain.   ACTIVITY LIMITATIONS: bending, standing, squatting, stairs, and transfers  PARTICIPATION LIMITATIONS: meal prep, cleaning, laundry, driving, shopping, community activity, and yard work  PERSONAL FACTORS: 1-2 comorbidities: previous menisectomy are also affecting patient's functional outcome.   REHAB POTENTIAL: Good  CLINICAL DECISION MAKING: Stable/uncomplicated  EVALUATION COMPLEXITY: Low   GOALS: Goals reviewed with patient? Yes  SHORT TERM GOALS: Target date: 07/26/2024  Patient will be independent with initial HEP  Baseline: Goal status: INITIAL  2.  Pain report to be no greater than 4/10   Baseline:  Goal status: INITIAL  3.  LEFS score to improve by 2 points Baseline:  Goal status: INITIAL  4.  Functional scores to improve by at least 1-2 seconds Baseline:  Goal status: INITIAL   LONG TERM GOALS: Target date: 08/23/2024  Patient to report pain no greater than 2/10  Baseline:  Goal status: INITIAL  2.  Patient to be independent with advanced HEP  Baseline:  Goal status: INITIAL  3.  Patient to be able to ascend and descend steps without pain or no greater than 2/10  Baseline:  Goal status: INITIAL  4.  Patient to be able to bend, stoop and squat with pain no greater than 2/10  Baseline:  Goal status: INITIAL  5.  Patient  to be able to stand or walk for at least 15 min without leg pain  Baseline:  Goal status: INITIAL  6.  LEFS score to improve by 3 points or better Baseline:  Goal status: INITIAL  7.  Functional scores to improve by 2-3 seconds  Baseline:  Goal status: INITIAL   PLAN:  PT FREQUENCY: 1-2x/week  PT DURATION: 8 weeks  PLANNED INTERVENTIONS: 97110-Therapeutic exercises, 97530- Therapeutic activity, [id number]- Neuromuscular re-education, 97535- Self Care, 02859- Manual therapy, [id number]- Gait training, [phone]- Canalith repositioning, [id number]- Aquatic Therapy, [phone]- Electrical stimulation (unattended), [phone]- Electrical stimulation (manual), [id number]- Vasopneumatic device, [id number]- Ultrasound, [id number]- Ionotophoresis 4mg /ml Dexamethasone, 20560 (1-2 muscles), 20561 (3+ muscles)- Dry Needling, Patient/Family education, Balance training, Stair training, Taping, Joint mobilization, Vestibular training, Cryotherapy, and Moist heat  PLAN FOR NEXT SESSION: Nustep, review HEP, begin quad rehab and hip stability training.    Delon B. Ellamae Lybeck, PT 06/28/24 8:59 PM [facility] [address] [place], [place] [address] Phone # [phone] Fax [phone]

## 2024-07-01 ENCOUNTER — Ambulatory Visit: Admitting: Physical Therapy

## 2024-07-01 ENCOUNTER — Encounter: Payer: Self-pay | Admitting: Physical Therapy

## 2024-07-01 DIAGNOSIS — R252 Cramp and spasm: Secondary | ICD-10-CM

## 2024-07-01 DIAGNOSIS — R262 Difficulty in walking, not elsewhere classified: Secondary | ICD-10-CM

## 2024-07-01 DIAGNOSIS — M25562 Pain in left knee: Secondary | ICD-10-CM

## 2024-07-01 DIAGNOSIS — M25662 Stiffness of left knee, not elsewhere classified: Secondary | ICD-10-CM

## 2024-07-01 DIAGNOSIS — M6281 Muscle weakness (generalized): Secondary | ICD-10-CM

## 2024-07-01 NOTE — Therapy (Signed)
 OUTPATIENT PHYSICAL THERAPY LOWER EXTREMITY TREATMENT   Patient Name: Sharon Robbins MRN: [id number] DOB:October 29, 1965, 58 y.o., female Today's Date: 07/01/2024  END OF SESSION:  PT End of Session - 07/01/24 1156     Visit Number 2    Date for Recertification  08/23/24    Authorization Type Aetna [place] Preferred    Authorization Time Period No auth required    PT Start Time 1103    PT Stop Time 1145    PT Time Calculation (min) 42 min    Activity Tolerance Patient tolerated treatment well    Behavior During Therapy WFL for tasks assessed/performed           Past Medical History:  Diagnosis Date   Headache    History of kidney stones    Past Surgical History:  Procedure Laterality Date   BACK SURGERY     CESAREAN SECTION     EXTRACORPOREAL SHOCK WAVE LITHOTRIPSY Right 05/31/2018   Procedure: RIGHT EXTRACORPOREAL SHOCK WAVE LITHOTRIPSY (ESWL);  Surgeon: Matilda Senior, MD;  Location: [facility];  Service: Urology;  Laterality: Right;   Patient Active Problem List   Diagnosis Date Noted   Overweight 02/15/2014    PCP: No PCP listed  REFERRING PROVIDER: Velinda Chancy, MD  REFERRING DIAG: Left anterior knee pain/IT band  THERAPY DIAG:  Acute pain of left knee  Stiffness of left knee, not elsewhere classified  Muscle weakness (generalized)  Difficulty in walking, not elsewhere classified  Cramp and spasm  Rationale for Evaluation and Treatment: Rehabilitation  ONSET DATE: 06/16/24  SUBJECTIVE:   SUBJECTIVE STATEMENT: Patient reports she is doing good today. She is not currently having any pain. She is compliant with HEP  From Eval: Patient had meniscus repair 03/06/23 with Dr. Chancy.  (Back fusion 2016)  She did rehab and started playing pickle ball   PERTINENT HISTORY: meniscus repair 03/06/23 with Dr. Chancy. Hx left fibular fx - non operative (2022) (Back fusion 2016)  PAIN:  Are you having pain? Yes: NPRS scale: 0/10 currently, at worst initially  7/10, has taken a week off and its a lot better Pain location: lateral joint space Pain description: sharp/aching  Aggravating factors: pickle ball, stairs Relieving factors: ice, ibuprofen  PRECAUTIONS: None  RED FLAGS: None   WEIGHT BEARING RESTRICTIONS: No  FALLS:  Has patient fallen in last 6 months? No  LIVING ENVIRONMENT: Lives with: lives with their spouse Lives in: House/apartment Stairs: No   OCCUPATION: desk work  PLOF: Independent, Independent with basic ADLs, Independent with household mobility without device, Independent with community mobility without device, Independent with homemaking with ambulation, Independent with gait, and Independent with transfers  PATIENT GOALS: To be able to continue playing pickle ball  NEXT MD VISIT: prn  OBJECTIVE:  Note: Objective measures were completed at Evaluation unless otherwise noted.  DIAGNOSTIC FINDINGS: Patient states   PATIENT SURVEYS:  LEFS  Extreme difficulty/unable (0), Quite a bit of difficulty (1), Moderate difficulty (2), Little difficulty (3), No difficulty (4) Survey date:  06/28/24  Score total:  50/80     COGNITION: Overall cognitive status: Within functional limits for tasks assessed     SENSATION: WFL  EDEMA:  Circumferential: Left: 40 cm, right : 39 cm  MUSCLE LENGTH: Hamstrings: Right 55 deg; Left 50 deg Thomas test: Right pos; Left pos  POSTURE: No Significant postural limitations  PALPATION: Milld crepitus left knee on open chain knee extension and flexion  LOWER EXTREMITY ROM:  WFL  LOWER EXTREMITY MMT:  Generally 4 to 4+/5 bilateral LE's  LOWER EXTREMITY SPECIAL TESTS:  Knee special tests: Held due to hx of menisectomy  FUNCTIONAL TESTS:  07/01/2024 5 times sit to stand: 9.19 sec no UE support. No knee pain Timed up and go (TUG): 6.27 sec  GAIT: Distance walked: 30 feet Assistive device utilized: None Level of assistance: Complete Independence Comments: antalgic  start up                                                                                                                                TREATMENT DATE:  07/01/2024 Patient self tissue massage with therapy roller to left quad and ITB Recumbent Bike Level 2 5 mins- PT present to discuss status Supine ITB stretch 2 x 30 sec bilateral  Supine piriformis stretch 2 x 30 sec bilateral  Supine Quad set + SLR 2 x 10 Lt  Bridge x 10 Side stepping with red loop around legs x 3 laps of 15/5ft 5STS 9.19 sec no UE support no knee pain TUG: 6.27sec Seated LAW 3# AW 2 x 10 bilateral (maybe do next weight next time?) 2in Ecc step downs x 10 bilateral with mirror infront as a visual cue 6 in step ups (leading with Lt ) x 5 then added 7# DB hold x 12 Lt  Educated patient on the use of foam roll to do cervical melt method    06/28/24  Initial eval completed and initiated HEP   PATIENT EDUCATION:  Education details: Initiated HEP Person educated: Patient Education method: Programmer, multimedia, Facilities manager, Verbal cues, and Handouts Education comprehension: verbalized understanding, returned demonstration, and verbal cues required  HOME EXERCISE PROGRAM: Access Code: [email or web address] URL: [email or web address] Date: 07/01/2024 Prepared by: Kristeen Sar  Exercises - Supine ITB Stretch with Strap  - 1 x daily - 7 x weekly - 1 sets - 3 reps - 30 sec hold - Supine Piriformis Stretch with Foot on Ground  - 1 x daily - 7 x weekly - 1 sets - 3 reps - 30 sec hold - Sidelying IT Band Foam Roll Mobilization  - 1 x daily - 7 x weekly - 3 sets - 10 reps - Active Straight Leg Raise with Quad Set  - 1 x daily - 7 x weekly - 2 sets - 10 reps - Supine Bridge  - 1 x daily - 7 x weekly - 1-2 sets - 10 reps - Seated Long Arc Quad  - 1 x daily - 7 x weekly - 2 sets - 10 reps  ASSESSMENT:  CLINICAL IMPRESSION: Beverley presents to first follow up appointment after evaluation. Patient verbalized compliance with HEP and  required minimal verbal cues for form correction. Incorporated hip and knee stability strengthening exercises today. LAQ with 3# AW was very challenging on left compared to right. Using a mirror as visual feedback was very helpful to make sure patient had proper hip and knee alignment. Patient should respond well with skilled  therapy. Patient will benefit from skilled PT to address the below impairments and improve overall function.    OBJECTIVE IMPAIRMENTS: difficulty walking, decreased strength, increased edema, increased fascial restrictions, increased muscle spasms, impaired flexibility, postural dysfunction, and pain.   ACTIVITY LIMITATIONS: bending, standing, squatting, stairs, and transfers  PARTICIPATION LIMITATIONS: meal prep, cleaning, laundry, driving, shopping, community activity, and yard work  PERSONAL FACTORS: 1-2 comorbidities: previous menisectomy are also affecting patient's functional outcome.   REHAB POTENTIAL: Good  CLINICAL DECISION MAKING: Stable/uncomplicated  EVALUATION COMPLEXITY: Low   GOALS: Goals reviewed with patient? Yes  SHORT TERM GOALS: Target date: 07/26/2024  Patient will be independent with initial HEP  Baseline: Goal status: INITIAL  2.  Pain report to be no greater than 4/10  Baseline:  Goal status: INITIAL  3.  LEFS score to improve by 2 points Baseline:  Goal status: INITIAL  4.  Functional scores to improve by at least 1-2 seconds Baseline:  Goal status: INITIAL   LONG TERM GOALS: Target date: 08/23/2024  Patient to report pain no greater than 2/10  Baseline:  Goal status: INITIAL  2.  Patient to be independent with advanced HEP  Baseline:  Goal status: INITIAL  3.  Patient to be able to ascend and descend steps without pain or no greater than 2/10  Baseline:  Goal status: INITIAL  4.  Patient to be able to bend, stoop and squat with pain no greater than 2/10  Baseline:  Goal status: INITIAL  5.  Patient to be able  to stand or walk for at least 15 min without leg pain  Baseline:  Goal status: INITIAL  6.  LEFS score to improve by 3 points or better Baseline:  Goal status: INITIAL  7.  Functional scores to improve by 2-3 seconds  Baseline:  Goal status: INITIAL   PLAN:  PT FREQUENCY: 1-2x/week  PT DURATION: 8 weeks  PLANNED INTERVENTIONS: 97110-Therapeutic exercises, 97530- Therapeutic activity, 97112- Neuromuscular re-education, 97535- Self Care, 02859- Manual therapy, [phone]- Gait training, [phone]- Canalith repositioning, [id number]- Aquatic Therapy, [phone]- Electrical stimulation (unattended), [phone]- Electrical stimulation (manual), [id number]- Vasopneumatic device, [id number]- Ultrasound, [id number]- Ionotophoresis 4mg /ml Dexamethasone, 79439 (1-2 muscles), 20561 (3+ muscles)- Dry Needling, Patient/Family education, Balance training, Stair training, Taping, Joint mobilization, Vestibular training, Cryotherapy, and Moist heat  PLAN FOR NEXT SESSION: assess response to treatment session; continue hip and knee strengthening ; single leg activities  Kristeen Sar, PT 07/01/24 11:58 AM [facility] [address] [place], [place] [address] Phone # [phone] Fax [phone]

## 2024-07-06 ENCOUNTER — Encounter: Payer: Self-pay | Admitting: Physical Therapy

## 2024-07-06 ENCOUNTER — Ambulatory Visit: Attending: Orthopedic Surgery | Admitting: Physical Therapy

## 2024-07-06 DIAGNOSIS — M6281 Muscle weakness (generalized): Secondary | ICD-10-CM | POA: Insufficient documentation

## 2024-07-06 DIAGNOSIS — R252 Cramp and spasm: Secondary | ICD-10-CM | POA: Insufficient documentation

## 2024-07-06 DIAGNOSIS — M25562 Pain in left knee: Secondary | ICD-10-CM | POA: Diagnosis present

## 2024-07-06 DIAGNOSIS — R262 Difficulty in walking, not elsewhere classified: Secondary | ICD-10-CM | POA: Diagnosis present

## 2024-07-06 DIAGNOSIS — R293 Abnormal posture: Secondary | ICD-10-CM | POA: Diagnosis present

## 2024-07-06 DIAGNOSIS — M25662 Stiffness of left knee, not elsewhere classified: Secondary | ICD-10-CM | POA: Insufficient documentation

## 2024-07-06 NOTE — Patient Instructions (Signed)

## 2024-07-06 NOTE — Therapy (Signed)
 OUTPATIENT PHYSICAL THERAPY LOWER EXTREMITY TREATMENT   Patient Name: Sharon Robbins MRN: [id number] DOB:[birth year], 58 y.o., female Today's Date: 07/06/2024  END OF SESSION:  PT End of Session - 07/06/24 1326     Visit Number 3    Date for Recertification  08/23/24    Authorization Type Aetna [place] Preferred    Authorization Time Period No auth required    PT Start Time 1235    PT Stop Time 1313    PT Time Calculation (min) 38 min    Activity Tolerance Patient tolerated treatment well    Behavior During Therapy WFL for tasks assessed/performed            Past Medical History:  Diagnosis Date   Headache    History of kidney stones    Past Surgical History:  Procedure Laterality Date   BACK SURGERY     CESAREAN SECTION     EXTRACORPOREAL SHOCK WAVE LITHOTRIPSY Right 05/31/2018   Procedure: RIGHT EXTRACORPOREAL SHOCK WAVE LITHOTRIPSY (ESWL);  Surgeon: Matilda Senior, MD;  Location: [facility];  Service: Urology;  Laterality: Right;   Patient Active Problem List   Diagnosis Date Noted   Overweight 02/15/2014    PCP: No PCP listed  REFERRING PROVIDER: Velinda Chancy, MD  REFERRING DIAG: Left anterior knee pain/IT band  THERAPY DIAG:  Acute pain of left knee  Stiffness of left knee, not elsewhere classified  Muscle weakness (generalized)  Difficulty in walking, not elsewhere classified  Cramp and spasm  Rationale for Evaluation and Treatment: Rehabilitation  ONSET DATE: 06/16/24  SUBJECTIVE:   SUBJECTIVE STATEMENT: Patient reports she is okay today. The knee pain is the same. She has been compliant with HEP.  From Eval: Patient had meniscus repair 03/06/23 with Dr. Chancy.  (Back fusion 2016)  She did rehab and started playing pickle ball   PERTINENT HISTORY: meniscus repair 03/06/23 with Dr. Chancy. Hx left fibular fx - non operative (2022) (Back fusion 2016)  PAIN:  Are you having pain? Yes: NPRS scale: 0/10 currently, at worst initially 7/10, has  taken a week off and its a lot better Pain location: lateral joint space Pain description: sharp/aching  Aggravating factors: pickle ball, stairs Relieving factors: ice, ibuprofen  PRECAUTIONS: None  RED FLAGS: None   WEIGHT BEARING RESTRICTIONS: No  FALLS:  Has patient fallen in last 6 months? No  LIVING ENVIRONMENT: Lives with: lives with their spouse Lives in: House/apartment Stairs: No   OCCUPATION: desk work  PLOF: Independent, Independent with basic ADLs, Independent with household mobility without device, Independent with community mobility without device, Independent with homemaking with ambulation, Independent with gait, and Independent with transfers  PATIENT GOALS: To be able to continue playing pickle ball  NEXT MD VISIT: prn  OBJECTIVE:  Note: Objective measures were completed at Evaluation unless otherwise noted.  DIAGNOSTIC FINDINGS: Patient states   PATIENT SURVEYS:  LEFS  Extreme difficulty/unable (0), Quite a bit of difficulty (1), Moderate difficulty (2), Little difficulty (3), No difficulty (4) Survey date:  06/28/24  Score total:  50/80     COGNITION: Overall cognitive status: Within functional limits for tasks assessed     SENSATION: WFL  EDEMA:  Circumferential: Left: 40 cm, right : 39 cm  MUSCLE LENGTH: Hamstrings: Right 55 deg; Left 50 deg Thomas test: Right pos; Left pos  POSTURE: No Significant postural limitations  PALPATION: Milld crepitus left knee on open chain knee extension and flexion  LOWER EXTREMITY ROM:  WFL  LOWER EXTREMITY MMT:  Generally 4 to 4+/5 bilateral LE's  LOWER EXTREMITY SPECIAL TESTS:  Knee special tests: Held due to hx of menisectomy  FUNCTIONAL TESTS:  07/01/2024 5 times sit to stand: 9.19 sec no UE support. No knee pain Timed up and go (TUG): 6.27 sec  GAIT: Distance walked: 30 feet Assistive device utilized: None Level of assistance: Complete Independence Comments: antalgic start  up                                                                                                                                TREATMENT DATE:  07/06/2024 Recumbent Bike Level 2 5 mins- PT present to discuss status Patient education on dry needling (provided educational handout to the chart & gave patient a copy)  Side stepping with red loop x 3 laps 15 ft 2in Ecc step downs 2 x 10 Lt  with mirror infront as a visual cue (challenging today) Single leg on therapad + opposite leg swing (forwards, sideways, and backwards) x 10 bilateral  Single leg balance + cone tap (cone on counter) x 20 touches bilateral  Patient education on structures around where her pain is and what could be contributing to pain.  Manual: Addady to left gluteals, lateral quad, and ITB for decreased trigger points and pain. PT monitored patient response and adjusted as needed.    07/01/2024 Patient self tissue massage with therapy roller to left quad and ITB Recumbent Bike Level 2 5 mins- PT present to discuss status Supine ITB stretch 2 x 30 sec bilateral  Supine piriformis stretch 2 x 30 sec bilateral  Supine Quad set + SLR 2 x 10 Lt  Bridge x 10 Side stepping with red loop around legs x 3 laps of 15/63ft 5STS 9.19 sec no UE support no knee pain TUG: 6.27sec Seated LAW 3# AW 2 x 10 bilateral (maybe do next weight next time?) 2in Ecc step downs x 10 bilateral with mirror infront as a visual cue 6 in step ups (leading with Lt ) x 5 then added 7# DB hold x 12 Lt  Educated patient on the use of foam roll to do cervical melt method    06/28/24  Initial eval completed and initiated HEP   PATIENT EDUCATION:  Education details: Initiated HEP Person educated: Patient Education method: Programmer, multimedia, Facilities manager, Verbal cues, and Handouts Education comprehension: verbalized understanding, returned demonstration, and verbal cues required  HOME EXERCISE PROGRAM: Access Code: [email or web address] URL:  [email or web address] Date: 07/01/2024 Prepared by: Kristeen Sar  Exercises - Supine ITB Stretch with Strap  - 1 x daily - 7 x weekly - 1 sets - 3 reps - 30 sec hold - Supine Piriformis Stretch with Foot on Ground  - 1 x daily - 7 x weekly - 1 sets - 3 reps - 30 sec hold - Sidelying IT Band Foam Roll Mobilization  - 1 x daily - 7 x weekly - 3 sets - 10 reps - Active Straight  Leg Raise with Quad Set  - 1 x daily - 7 x weekly - 2 sets - 10 reps - Supine Bridge  - 1 x daily - 7 x weekly - 1-2 sets - 10 reps - Seated Long Arc Quad  - 1 x daily - 7 x weekly - 2 sets - 10 reps  ASSESSMENT:  CLINICAL IMPRESSION: Beverley presents with her normal hip/ knee pain levels. She has been compliant with HEP. Educated patient on the benefit of dry needling and how it could be helpful to target trigger points in her left hip and knee. Patient responded well to manual assisted techniques and verbalized decreased pain. PT used a graphic of the hip to describe where patient's pain is located and what could be influencing it. Plan to possibly try dry needling next session. Patient will benefit from skilled PT to address the below impairments and improve overall function.     OBJECTIVE IMPAIRMENTS: difficulty walking, decreased strength, increased edema, increased fascial restrictions, increased muscle spasms, impaired flexibility, postural dysfunction, and pain.   ACTIVITY LIMITATIONS: bending, standing, squatting, stairs, and transfers  PARTICIPATION LIMITATIONS: meal prep, cleaning, laundry, driving, shopping, community activity, and yard work  PERSONAL FACTORS: 1-2 comorbidities: previous menisectomy are also affecting patient's functional outcome.   REHAB POTENTIAL: Good  CLINICAL DECISION MAKING: Stable/uncomplicated  EVALUATION COMPLEXITY: Low   GOALS: Goals reviewed with patient? Yes  SHORT TERM GOALS: Target date: 07/26/2024  Patient will be independent with initial HEP   Baseline: Goal status: INITIAL  2.  Pain report to be no greater than 4/10  Baseline:  Goal status: INITIAL  3.  LEFS score to improve by 2 points Baseline:  Goal status: INITIAL  4.  Functional scores to improve by at least 1-2 seconds Baseline:  Goal status: INITIAL   LONG TERM GOALS: Target date: 08/23/2024  Patient to report pain no greater than 2/10  Baseline:  Goal status: INITIAL  2.  Patient to be independent with advanced HEP  Baseline:  Goal status: INITIAL  3.  Patient to be able to ascend and descend steps without pain or no greater than 2/10  Baseline:  Goal status: INITIAL  4.  Patient to be able to bend, stoop and squat with pain no greater than 2/10  Baseline:  Goal status: INITIAL  5.  Patient to be able to stand or walk for at least 15 min without leg pain  Baseline:  Goal status: INITIAL  6.  LEFS score to improve by 3 points or better Baseline:  Goal status: INITIAL  7.  Functional scores to improve by 2-3 seconds  Baseline:  Goal status: INITIAL   PLAN:  PT FREQUENCY: 1-2x/week  PT DURATION: 8 weeks  PLANNED INTERVENTIONS: 97110-Therapeutic exercises, 97530- Therapeutic activity, 97112- Neuromuscular re-education, 97535- Self Care, 02859- Manual therapy, [phone]- Gait training, [phone]- Canalith repositioning, [id number]- Aquatic Therapy, [phone]- Electrical stimulation (unattended), [phone]- Electrical stimulation (manual), [id number]- Vasopneumatic device, [id number]- Ultrasound, [id number]- Ionotophoresis 4mg /ml Dexamethasone, 79439 (1-2 muscles), [id number] (3+ muscles)- Dry Needling, Patient/Family education, Balance training, Stair training, Taping, Joint mobilization, Vestibular training, Cryotherapy, and Moist heat  PLAN FOR NEXT SESSION: dry needling if patient agrees (left glutes, ITB, and lateral quad) continue hip and knee strengthening ; single leg activities  Kristeen Sar, PT 07/06/24 1:26 PM [facility] [address]  [address] [place], [place] [address] Phone # [phone] Fax [phone]

## 2024-07-08 ENCOUNTER — Ambulatory Visit

## 2024-07-08 DIAGNOSIS — M25662 Stiffness of left knee, not elsewhere classified: Secondary | ICD-10-CM

## 2024-07-08 DIAGNOSIS — M25562 Pain in left knee: Secondary | ICD-10-CM

## 2024-07-08 DIAGNOSIS — R262 Difficulty in walking, not elsewhere classified: Secondary | ICD-10-CM

## 2024-07-08 DIAGNOSIS — R252 Cramp and spasm: Secondary | ICD-10-CM

## 2024-07-08 DIAGNOSIS — M6281 Muscle weakness (generalized): Secondary | ICD-10-CM

## 2024-07-08 NOTE — Therapy (Signed)
 OUTPATIENT PHYSICAL THERAPY LOWER EXTREMITY TREATMENT   Patient Name: Sharon Robbins MRN: [id number] DOB:[birth year], 58 y.o., female Today's Date: 07/08/2024  END OF SESSION:  PT End of Session - 07/08/24 0808     Visit Number 4    Date for Recertification  08/23/24    Authorization Type Aetna [place] Preferred    Authorization Time Period No auth required    PT Start Time 0805    PT Stop Time 0835    PT Time Calculation (min) 30 min    Activity Tolerance Patient tolerated treatment well    Behavior During Therapy [facility] for tasks assessed/performed            Past Medical History:  Diagnosis Date   Headache    History of kidney stones    Past Surgical History:  Procedure Laterality Date   BACK SURGERY     CESAREAN SECTION     EXTRACORPOREAL SHOCK WAVE LITHOTRIPSY Right 05/31/2018   Procedure: RIGHT EXTRACORPOREAL SHOCK WAVE LITHOTRIPSY (ESWL);  Surgeon: Matilda Senior, MD;  Location: [facility];  Service: Urology;  Laterality: Right;   Patient Active Problem List   Diagnosis Date Noted   Overweight 02/15/2014    PCP: No PCP listed  REFERRING PROVIDER: Velinda Chancy, MD  REFERRING DIAG: Left anterior knee pain/IT band  THERAPY DIAG:  Acute pain of left knee  Stiffness of left knee, not elsewhere classified  Muscle weakness (generalized)  Difficulty in walking, not elsewhere classified  Cramp and spasm  Rationale for Evaluation and Treatment: Rehabilitation  ONSET DATE: 06/16/24  SUBJECTIVE:   SUBJECTIVE STATEMENT: Patient reports she is having lateral hip pain and knee pain today.  Pain 3/10 in both.   From Eval: Patient had meniscus repair 03/06/23 with Dr. Chancy.  (Back fusion 2016)  She did rehab and started playing pickle ball   PERTINENT HISTORY: meniscus repair 03/06/23 with Dr. Chancy. Hx left fibular fx - non operative (2022) (Back fusion 2016)  PAIN:  Are you having pain? Yes: NPRS scale: 0/10 currently, at worst initially 7/10, has taken a  week off and its a lot better Pain location: lateral joint space Pain description: sharp/aching  Aggravating factors: pickle ball, stairs Relieving factors: ice, ibuprofen  PRECAUTIONS: None  RED FLAGS: None   WEIGHT BEARING RESTRICTIONS: No  FALLS:  Has patient fallen in last 6 months? No  LIVING ENVIRONMENT: Lives with: lives with their spouse Lives in: House/apartment Stairs: No   OCCUPATION: desk work  PLOF: Independent, Independent with basic ADLs, Independent with household mobility without device, Independent with community mobility without device, Independent with homemaking with ambulation, Independent with gait, and Independent with transfers  PATIENT GOALS: To be able to continue playing pickle ball  NEXT MD VISIT: prn  OBJECTIVE:  Note: Objective measures were completed at Evaluation unless otherwise noted.  DIAGNOSTIC FINDINGS: Patient states   PATIENT SURVEYS:  LEFS  Extreme difficulty/unable (0), Quite a bit of difficulty (1), Moderate difficulty (2), Little difficulty (3), No difficulty (4) Survey date:  06/28/24  Score total:  50/80     COGNITION: Overall cognitive status: Within functional limits for tasks assessed     SENSATION: WFL  EDEMA:  Circumferential: Left: 40 cm, right : 39 cm  MUSCLE LENGTH: Hamstrings: Right 55 deg; Left 50 deg Thomas test: Right pos; Left pos  POSTURE: No Significant postural limitations  PALPATION: Milld crepitus left knee on open chain knee extension and flexion  LOWER EXTREMITY ROM:  WFL  LOWER EXTREMITY MMT:  Generally 4 to 4+/5 bilateral LE's  LOWER EXTREMITY SPECIAL TESTS:  Knee special tests: Held due to hx of menisectomy  FUNCTIONAL TESTS:  07/01/2024 5 times sit to stand: 9.19 sec no UE support. No knee pain Timed up and go (TUG): 6.27 sec  GAIT: Distance walked: 30 feet Assistive device utilized: None Level of assistance: Complete Independence Comments: antalgic start up                                                                                                                                 TREATMENT DATE:  07/08/2024 Nustep x 5 min level 3 IT band stretch 3 x 30 sec left Supine piriformis stretch 3 x 30 sec Side lying IT band rolling and manual STM Trigger Point Dry Needling Initial Treatment: Pt instructed on Dry Needling rational, procedures, and possible side effects. Pt instructed to expect mild to moderate muscle soreness later in the day and/or into the next day.  Pt instructed in methods to reduce muscle soreness. Pt instructed to continue prescribed HEP. Because Dry Needling was performed over or adjacent to a lung field, pt was educated on S/S of pneumothorax and to seek immediate medical attention should they occur.  Patient was educated on signs and symptoms of infection and other risk factors and advised to seek medical attention should they occur.  Patient verbalized understanding of these instructions and education.  Patient Verbal Consent Given: Yes Education Handout Provided: Yes Muscles Treated: Glut max, min and med, piriformis, lateral quad Electrical Stimulation Performed: No Treatment Response/Outcome: Skilled palpation used to identify taut bands and trigger points.  Once identified, dry needling techniques used to treat these areas.  Twitch response ellicited along with palpable elongation of muscle.  Following treatment, patient reported reduction in overall pain and less muscle tension at the lateral left LE.   07/06/2024 Recumbent Bike Level 2 5 mins- PT present to discuss status Patient education on dry needling (provided educational handout to the chart & gave patient a copy)  Side stepping with red loop x 3 laps 15 ft 2in Ecc step downs 2 x 10 Lt  with mirror infront as a visual cue (challenging today) Single leg on therapad + opposite leg swing (forwards, sideways, and backwards) x 10 bilateral  Single leg balance + cone tap (cone  on counter) x 20 touches bilateral  Patient education on structures around where her pain is and what could be contributing to pain.  Manual: Addady to left gluteals, lateral quad, and ITB for decreased trigger points and pain. PT monitored patient response and adjusted as needed.    07/01/2024 Patient self tissue massage with therapy roller to left quad and ITB Recumbent Bike Level 2 5 mins- PT present to discuss status Supine ITB stretch 2 x 30 sec bilateral  Supine piriformis stretch 2 x 30 sec bilateral  Supine Quad set + SLR 2 x 10 Lt  Bridge x 10 Side  stepping with red loop around legs x 3 laps of 15/41ft 5STS 9.19 sec no UE support no knee pain TUG: 6.27sec Seated LAW 3# AW 2 x 10 bilateral (maybe do next weight next time?) 2in Ecc step downs x 10 bilateral with mirror infront as a visual cue 6 in step ups (leading with Lt ) x 5 then added 7# DB hold x 12 Lt  Educated patient on the use of foam roll to do cervical melt method    06/28/24  Initial eval completed and initiated HEP   PATIENT EDUCATION:  Education details: Initiated HEP Person educated: Patient Education method: Programmer, multimedia, Facilities manager, Verbal cues, and Handouts Education comprehension: verbalized understanding, returned demonstration, and verbal cues required  HOME EXERCISE PROGRAM: Access Code: [email or web address] URL: [email or web address] Date: 07/01/2024 Prepared by: Kristeen Sar  Exercises - Supine ITB Stretch with Strap  - 1 x daily - 7 x weekly - 1 sets - 3 reps - 30 sec hold - Supine Piriformis Stretch with Foot on Ground  - 1 x daily - 7 x weekly - 1 sets - 3 reps - 30 sec hold - Sidelying IT Band Foam Roll Mobilization  - 1 x daily - 7 x weekly - 3 sets - 10 reps - Active Straight Leg Raise with Quad Set  - 1 x daily - 7 x weekly - 2 sets - 10 reps - Supine Bridge  - 1 x daily - 7 x weekly - 1-2 sets - 10 reps - Seated Long Arc Quad  - 1 x daily - 7 x weekly - 2 sets - 10  reps  ASSESSMENT:  CLINICAL IMPRESSION: Beverley continues to experience hip and knee pain that is impeding her usual daily activities and usual exercise.  We added DN today.  She had significant twitch responses and deep ache in all muscle groups.  She reported decreased pain after treatment and gait was less guarded and antalgic.  She is well motivated and compliant.  She should continue to do well.  Patient will benefit from skilled PT to address the below impairments and improve overall function.     OBJECTIVE IMPAIRMENTS: difficulty walking, decreased strength, increased edema, increased fascial restrictions, increased muscle spasms, impaired flexibility, postural dysfunction, and pain.   ACTIVITY LIMITATIONS: bending, standing, squatting, stairs, and transfers  PARTICIPATION LIMITATIONS: meal prep, cleaning, laundry, driving, shopping, community activity, and yard work  PERSONAL FACTORS: 1-2 comorbidities: previous menisectomy are also affecting patient's functional outcome.   REHAB POTENTIAL: Good  CLINICAL DECISION MAKING: Stable/uncomplicated  EVALUATION COMPLEXITY: Low   GOALS: Goals reviewed with patient? Yes  SHORT TERM GOALS: Target date: 07/26/2024  Patient will be independent with initial HEP  Baseline: Goal status: MET 07/08/24  2.  Pain report to be no greater than 4/10  Baseline:  Goal status: MET 07/08/24  3.  LEFS score to improve by 2 points Baseline:  Goal status: INITIAL  4.  Functional scores to improve by at least 1-2 seconds Baseline:  Goal status: INITIAL   LONG TERM GOALS: Target date: 08/23/2024  Patient to report pain no greater than 2/10  Baseline:  Goal status: INITIAL  2.  Patient to be independent with advanced HEP  Baseline:  Goal status: INITIAL  3.  Patient to be able to ascend and descend steps without pain or no greater than 2/10  Baseline:  Goal status: INITIAL  4.  Patient to be able to bend, stoop and squat with pain no  greater than 2/10  Baseline:  Goal status: INITIAL  5.  Patient to be able to stand or walk for at least 15 min without leg pain  Baseline:  Goal status: INITIAL  6.  LEFS score to improve by 3 points or better Baseline:  Goal status: INITIAL  7.  Functional scores to improve by 2-3 seconds  Baseline:  Goal status: INITIAL   PLAN:  PT FREQUENCY: 1-2x/week  PT DURATION: 8 weeks  PLANNED INTERVENTIONS: 97110-Therapeutic exercises, 97530- Therapeutic activity, [id number]- Neuromuscular re-education, 97535- Self Care, 02859- Manual therapy, [phone]- Gait training, [phone]- Canalith repositioning, [id number]- Aquatic Therapy, [phone]- Electrical stimulation (unattended), [phone]- Electrical stimulation (manual), [id number]- Vasopneumatic device, [id number]- Ultrasound, [id number]- Ionotophoresis 4mg /ml Dexamethasone, 79439 (1-2 muscles), [id number] (3+ muscles)- Dry Needling, Patient/Family education, Balance training, Stair training, Taping, Joint mobilization, Vestibular training, Cryotherapy, and Moist heat  PLAN FOR NEXT SESSION: Assess response to dry needling (left glutes, ITB, and lateral quad) resume hip and knee strengthening ; single leg activities  Braeden Dolinski B. Fatima Fedie, PT 07/08/24 8:47 AM [facility] [address], [address] [place], [place] [address] Phone # [phone] Fax [phone]

## 2024-07-12 ENCOUNTER — Encounter: Payer: Self-pay | Admitting: Physical Therapy

## 2024-07-12 ENCOUNTER — Ambulatory Visit: Admitting: Physical Therapy

## 2024-07-12 DIAGNOSIS — M25662 Stiffness of left knee, not elsewhere classified: Secondary | ICD-10-CM

## 2024-07-12 DIAGNOSIS — M25562 Pain in left knee: Secondary | ICD-10-CM

## 2024-07-12 DIAGNOSIS — M6281 Muscle weakness (generalized): Secondary | ICD-10-CM

## 2024-07-12 DIAGNOSIS — R262 Difficulty in walking, not elsewhere classified: Secondary | ICD-10-CM

## 2024-07-12 DIAGNOSIS — R252 Cramp and spasm: Secondary | ICD-10-CM

## 2024-07-12 NOTE — Therapy (Signed)
 OUTPATIENT PHYSICAL THERAPY LOWER EXTREMITY TREATMENT   Patient Name: Sharon Robbins MRN: [id number] DOB:01/27/1966, 58 y.o., female Today's Date: 07/12/2024  END OF SESSION:  PT End of Session - 07/12/24 1346     Visit Number 5    Date for Recertification  08/23/24    Authorization Type [employer] Preferred    Authorization Time Period No auth required    PT Start Time 1107    PT Stop Time 1205    PT Time Calculation (min) 58 min    Activity Tolerance Patient tolerated treatment well    Behavior During Therapy WFL for tasks assessed/performed             Past Medical History:  Diagnosis Date   Headache    History of kidney stones    Past Surgical History:  Procedure Laterality Date   BACK SURGERY     CESAREAN SECTION     EXTRACORPOREAL SHOCK WAVE LITHOTRIPSY Right 05/31/2018   Procedure: RIGHT EXTRACORPOREAL SHOCK WAVE LITHOTRIPSY (ESWL);  Surgeon: Matilda Senior, MD;  Location: [facility];  Service: Urology;  Laterality: Right;   Patient Active Problem List   Diagnosis Date Noted   Overweight 02/15/2014    PCP: No PCP listed  REFERRING PROVIDER: Velinda Chancy, MD  REFERRING DIAG: Left anterior knee pain/IT band  THERAPY DIAG:  Acute pain of left knee  Stiffness of left knee, not elsewhere classified  Muscle weakness (generalized)  Difficulty in walking, not elsewhere classified  Cramp and spasm  Rationale for Evaluation and Treatment: Rehabilitation  ONSET DATE: 06/16/24  SUBJECTIVE:   SUBJECTIVE STATEMENT: Patient report she did not feel a big difference after dry needling. She hurt after pickleball yesterday and walking 1 hour on Saturday.   From Eval: Patient had meniscus repair 03/06/23 with Dr. Chancy.  (Back fusion 2016)  She did rehab and started playing pickle ball   PERTINENT HISTORY: meniscus repair 03/06/23 with Dr. Chancy. Hx left fibular fx - non operative (2022) (Back fusion 2016)  PAIN:  Are you having pain? Yes: NPRS scale:  0/10 currently, at worst initially 7/10, has taken a week off and its a lot better Pain location: lateral joint space Pain description: sharp/aching  Aggravating factors: pickle ball, stairs Relieving factors: ice, ibuprofen  PRECAUTIONS: None  RED FLAGS: None   WEIGHT BEARING RESTRICTIONS: No  FALLS:  Has patient fallen in last 6 months? No  LIVING ENVIRONMENT: Lives with: lives with their spouse Lives in: House/apartment Stairs: No   OCCUPATION: desk work  PLOF: Independent, Independent with basic ADLs, Independent with household mobility without device, Independent with community mobility without device, Independent with homemaking with ambulation, Independent with gait, and Independent with transfers  PATIENT GOALS: To be able to continue playing pickle ball  NEXT MD VISIT: prn  OBJECTIVE:  Note: Objective measures were completed at Evaluation unless otherwise noted.  DIAGNOSTIC FINDINGS: Patient states   PATIENT SURVEYS:  LEFS  Extreme difficulty/unable (0), Quite a bit of difficulty (1), Moderate difficulty (2), Little difficulty (3), No difficulty (4) Survey date:  06/28/24  Score total:  50/80     COGNITION: Overall cognitive status: Within functional limits for tasks assessed     SENSATION: WFL  EDEMA:  Circumferential: Left: 40 cm, right : 39 cm  MUSCLE LENGTH: Hamstrings: Right 55 deg; Left 50 deg Thomas test: Right pos; Left pos  POSTURE: No Significant postural limitations  PALPATION: Milld crepitus left knee on open chain knee extension and flexion  LOWER EXTREMITY  ROM:  WFL  LOWER EXTREMITY MMT:  Generally 4 to 4+/5 bilateral LE's  LOWER EXTREMITY SPECIAL TESTS:  Knee special tests: Held due to hx of menisectomy  FUNCTIONAL TESTS:  07/01/2024 5 times sit to stand: 9.19 sec no UE support. No knee pain Timed up and go (TUG): 6.27 sec  GAIT: Distance walked: 30 feet Assistive device utilized: None Level of assistance: Complete  Independence Comments: antalgic start up                                                                                                                                TREATMENT DATE:  07/12/2024 Update on status. Response to DN, plan for therapy, options of returning to the MD Supine Bridge x 10  SL clamshell 2 x 10 bilateral  SL reverse clamshell 2 x 10 bilateral  Hooklying TA activation + hip abduction with red loop 2 x 10 Hooklying TA activation + march with red loop 2 x 10 bilateral  Supine piriformis stretch 3 x 30 sec Hip internal/ external rotation stretch x 10 5 sec hold Vasopneumatic device three snow flakes medium compression x 15 mins    07/08/2024 Nustep x 5 min level 3 IT band stretch 3 x 30 sec left Supine piriformis stretch 3 x 30 sec Side lying IT band rolling and manual STM Trigger Point Dry Needling Initial Treatment: Pt instructed on Dry Needling rational, procedures, and possible side effects. Pt instructed to expect mild to moderate muscle soreness later in the day and/or into the next day.  Pt instructed in methods to reduce muscle soreness. Pt instructed to continue prescribed HEP. Because Dry Needling was performed over or adjacent to a lung field, pt was educated on S/S of pneumothorax and to seek immediate medical attention should they occur.  Patient was educated on signs and symptoms of infection and other risk factors and advised to seek medical attention should they occur.  Patient verbalized understanding of these instructions and education.  Patient Verbal Consent Given: Yes Education Handout Provided: Yes Muscles Treated: Glut max, min and med, piriformis, lateral quad Electrical Stimulation Performed: No Treatment Response/Outcome: Skilled palpation used to identify taut bands and trigger points.  Once identified, dry needling techniques used to treat these areas.  Twitch response ellicited along with palpable elongation of muscle.  Following  treatment, patient reported reduction in overall pain and less muscle tension at the lateral left LE.   07/06/2024 Recumbent Bike Level 2 5 mins- PT present to discuss status Patient education on dry needling (provided educational handout to the chart & gave patient a copy)  Side stepping with red loop x 3 laps 15 ft 2in Ecc step downs 2 x 10 Lt  with mirror infront as a visual cue (challenging today) Single leg on therapad + opposite leg swing (forwards, sideways, and backwards) x 10 bilateral  Single leg balance + cone tap (cone on counter) x 20 touches bilateral  Patient education on structures around where her pain is and what could be contributing to pain.  Manual: Addady to left gluteals, lateral quad, and ITB for decreased trigger points and pain. PT monitored patient response and adjusted as needed.    07/01/2024 Patient self tissue massage with therapy roller to left quad and ITB Recumbent Bike Level 2 5 mins- PT present to discuss status Supine ITB stretch 2 x 30 sec bilateral  Supine piriformis stretch 2 x 30 sec bilateral  Supine Quad set + SLR 2 x 10 Lt  Bridge x 10 Side stepping with red loop around legs x 3 laps of 15/37ft 5STS 9.19 sec no UE support no knee pain TUG: 6.27sec Seated LAW 3# AW 2 x 10 bilateral (maybe do next weight next time?) 2in Ecc step downs x 10 bilateral with mirror infront as a visual cue 6 in step ups (leading with Lt ) x 5 then added 7# DB hold x 12 Lt  Educated patient on the use of foam roll to do cervical melt method    06/28/24  Initial eval completed and initiated HEP   PATIENT EDUCATION:  Education details: Initiated HEP Person educated: Patient Education method: Programmer, multimedia, Facilities manager, Verbal cues, and Handouts Education comprehension: verbalized understanding, returned demonstration, and verbal cues required  HOME EXERCISE PROGRAM: Access Code: [email or web address] URL: [email or web address] Date: 07/12/2024 Prepared by:  Kristeen Sar  Exercises - Supine ITB Stretch with Strap  - 1 x daily - 7 x weekly - 1 sets - 3 reps - 30 sec hold - Supine Piriformis Stretch with Foot on Ground  - 1 x daily - 7 x weekly - 1 sets - 3 reps - 30 sec hold - Sidelying IT Band Foam Roll Mobilization  - 1 x daily - 7 x weekly - 3 sets - 10 reps - Active Straight Leg Raise with Quad Set  - 1 x daily - 7 x weekly - 2 sets - 10 reps - Supine Bridge  - 1 x daily - 7 x weekly - 1-2 sets - 10 reps - Seated Long Arc Quad  - 1 x daily - 7 x weekly - 2 sets - 10 reps - Supine Hip Internal and External Rotation  - 1 x daily - 7 x weekly - 1 sets - 10 reps - 5 hold - Hooklying Clamshell with Resistance  - 1 x daily - 7 x weekly - 2 sets - 10 reps - Clamshell  - 1 x daily - 7 x weekly - 2 sets - 10 reps - Sidelying Reverse Clamshell  - 1 x daily - 7 x weekly - 2 sets - 10 reps  ASSESSMENT:  CLINICAL IMPRESSION: Had a lengthy discussion today about patient's pain and she feels after physical activity. After playing pickleball and walking an hour on Saturday patient noted increased pain and swelling. Increased swelling was still noted today. Educated patient on decreasing time playing pickleball and walking to she how she feels. Discussed the possibility of returning to the doctor. Provided the option of going to [facility]. Decreased exercise intensity today but continued to focus on hip strengthening. Patient responded well to all exercises today, so added them to HEP. Patient will benefit from skilled PT to address the below impairments and improve overall function.    OBJECTIVE IMPAIRMENTS: difficulty walking, decreased strength, increased edema, increased fascial restrictions, increased muscle spasms, impaired flexibility, postural dysfunction, and pain.   ACTIVITY LIMITATIONS: bending, standing, squatting, stairs, and transfers  PARTICIPATION  LIMITATIONS: meal prep, cleaning, laundry, driving, shopping, community activity, and  yard work  PERSONAL FACTORS: 1-2 comorbidities: previous menisectomy are also affecting patient's functional outcome.   REHAB POTENTIAL: Good  CLINICAL DECISION MAKING: Stable/uncomplicated  EVALUATION COMPLEXITY: Low   GOALS: Goals reviewed with patient? Yes  SHORT TERM GOALS: Target date: 07/26/2024  Patient will be independent with initial HEP  Baseline: Goal status: MET 07/08/24  2.  Pain report to be no greater than 4/10  Baseline:  Goal status: MET 07/08/24  3.  LEFS score to improve by 2 points Baseline:  Goal status: INITIAL  4.  Functional scores to improve by at least 1-2 seconds Baseline:  Goal status: INITIAL   LONG TERM GOALS: Target date: 08/23/2024  Patient to report pain no greater than 2/10  Baseline:  Goal status: INITIAL  2.  Patient to be independent with advanced HEP  Baseline:  Goal status: INITIAL  3.  Patient to be able to ascend and descend steps without pain or no greater than 2/10  Baseline:  Goal status: INITIAL  4.  Patient to be able to bend, stoop and squat with pain no greater than 2/10  Baseline:  Goal status: INITIAL  5.  Patient to be able to stand or walk for at least 15 min without leg pain  Baseline:  Goal status: INITIAL  6.  LEFS score to improve by 3 points or better Baseline:  Goal status: INITIAL  7.  Functional scores to improve by 2-3 seconds  Baseline:  Goal status: INITIAL   PLAN:  PT FREQUENCY: 1-2x/week  PT DURATION: 8 weeks  PLANNED INTERVENTIONS: 97110-Therapeutic exercises, 97530- Therapeutic activity, 97112- Neuromuscular re-education, 97535- Self Care, 02859- Manual therapy, [phone]- Gait training, [phone]- Canalith repositioning, [id number]- Aquatic Therapy, [phone]- Electrical stimulation (unattended), [id number]- Electrical stimulation (manual), [id number]- Vasopneumatic device, [id number]- Ultrasound, [id number]- Ionotophoresis 4mg /ml Dexamethasone, 20560 (1-2 muscles), 20561 (3+ muscles)- Dry Needling, Patient/Family  education, Balance training, Stair training, Taping, Joint mobilization, Vestibular training, Cryotherapy, and Moist heat  PLAN FOR NEXT SESSION: assess pain and updated HEP; possibly continue with lower intensity exercises    Kristeen Sar, PT 07/12/24 1:47 PM [facility] [address] [place], [place] [address] Phone # [phone] Fax [phone]

## 2024-07-14 ENCOUNTER — Ambulatory Visit

## 2024-07-14 DIAGNOSIS — R262 Difficulty in walking, not elsewhere classified: Secondary | ICD-10-CM

## 2024-07-14 DIAGNOSIS — M25562 Pain in left knee: Secondary | ICD-10-CM

## 2024-07-14 DIAGNOSIS — M6281 Muscle weakness (generalized): Secondary | ICD-10-CM

## 2024-07-14 DIAGNOSIS — R293 Abnormal posture: Secondary | ICD-10-CM

## 2024-07-14 DIAGNOSIS — M25662 Stiffness of left knee, not elsewhere classified: Secondary | ICD-10-CM

## 2024-07-14 DIAGNOSIS — R252 Cramp and spasm: Secondary | ICD-10-CM

## 2024-07-14 NOTE — Therapy (Signed)
 OUTPATIENT PHYSICAL THERAPY LOWER EXTREMITY TREATMENT   Patient Name: Sharon Robbins MRN: [id number] DOB:1966-01-18, 58 y.o., female Today's Date: 07/14/2024  END OF SESSION:  PT End of Session - 07/14/24 1748     Visit Number 6    Date for Recertification  08/23/24    Authorization Type Aetna  Preferred    Authorization Time Period No auth required    PT Start Time 1445    PT Stop Time 1535    PT Time Calculation (min) 50 min    Activity Tolerance Patient tolerated treatment well    Behavior During Therapy WFL for tasks assessed/performed              Past Medical History:  Diagnosis Date   Headache    History of kidney stones    Past Surgical History:  Procedure Laterality Date   BACK SURGERY     CESAREAN SECTION     EXTRACORPOREAL SHOCK WAVE LITHOTRIPSY Right 05/31/2018   Procedure: RIGHT EXTRACORPOREAL SHOCK WAVE LITHOTRIPSY (ESWL);  Surgeon: Matilda Senior, MD;  Location: [facility];  Service: Urology;  Laterality: Right;   Patient Active Problem List   Diagnosis Date Noted   Overweight 02/15/2014    PCP: No PCP listed  REFERRING PROVIDER: Velinda Chancy, MD  REFERRING DIAG: Left anterior knee pain/IT band  THERAPY DIAG:  Acute pain of left knee  Stiffness of left knee, not elsewhere classified  Muscle weakness (generalized)  Difficulty in walking, not elsewhere classified  Cramp and spasm  Abnormal posture  Rationale for Evaluation and Treatment: Rehabilitation  ONSET DATE: 06/16/24  SUBJECTIVE:   SUBJECTIVE STATEMENT: Patient reports she is doing a little better today.  Still playing pickle ball but backed down a lot and not competing  From Eval: Patient had meniscus repair 03/06/23 with Dr. Chancy.  (Back fusion 2016)  She did rehab and started playing pickle ball   PERTINENT HISTORY: meniscus repair 03/06/23 with Dr. Chancy. Hx left fibular fx - non operative (2022) (Back fusion 2016)  PAIN:  Are you having pain? Yes: NPRS  scale: 0/10 currently, at worst initially 7/10, has taken a week off and its a lot better Pain location: lateral joint space Pain description: sharp/aching  Aggravating factors: pickle ball, stairs Relieving factors: ice, ibuprofen  PRECAUTIONS: None  RED FLAGS: None   WEIGHT BEARING RESTRICTIONS: No  FALLS:  Has patient fallen in last 6 months? No  LIVING ENVIRONMENT: Lives with: lives with their spouse Lives in: House/apartment Stairs: No   OCCUPATION: desk work  PLOF: Independent, Independent with basic ADLs, Independent with household mobility without device, Independent with community mobility without device, Independent with homemaking with ambulation, Independent with gait, and Independent with transfers  PATIENT GOALS: To be able to continue playing pickle ball  NEXT MD VISIT: prn  OBJECTIVE:  Note: Objective measures were completed at Evaluation unless otherwise noted.  DIAGNOSTIC FINDINGS: Patient states   PATIENT SURVEYS:  LEFS  Extreme difficulty/unable (0), Quite a bit of difficulty (1), Moderate difficulty (2), Little difficulty (3), No difficulty (4) Survey date:  06/28/24  Score total:  50/80     COGNITION: Overall cognitive status: Within functional limits for tasks assessed     SENSATION: WFL  EDEMA:  Circumferential: Left: 40 cm, right : 39 cm  MUSCLE LENGTH: Hamstrings: Right 55 deg; Left 50 deg Thomas test: Right pos; Left pos  POSTURE: No Significant postural limitations  PALPATION: Milld crepitus left knee on open chain knee extension and flexion  LOWER EXTREMITY ROM:  WFL  LOWER EXTREMITY MMT:  Generally 4 to 4+/5 bilateral LE's  LOWER EXTREMITY SPECIAL TESTS:  Knee special tests: Held due to hx of menisectomy  FUNCTIONAL TESTS:  07/01/2024 5 times sit to stand: 9.19 sec no UE support. No knee pain Timed up and go (TUG): 6.27 sec  GAIT: Distance walked: 30 feet Assistive device utilized: None Level of assistance:  Complete Independence Comments: antalgic start up                                                                                                                                TREATMENT DATE:  07/14/2024 Nustep x 5 min level 5 PT present to discuss status Seated LAQ with 6# 2 x 10 both LE Supine quad set x 20 both Supine TKE with small green noodle with 6# 2 x 10 both Supine TKE with larger green noodle with 6# 2 x 10 both  Supine SAQ with 6# 2 x 10 both SLR 2 x 10 0# both Supine IT band stretch 3 x 30  Addaday to left IT band and gluteals x 5 min Educated on use of ice and proper pain control .  Vasopneumatic device three snow flakes medium compression x 15 mins  07/12/2024 Update on status. Response to DN, plan for therapy, options of returning to the MD Supine Bridge x 10  SL clamshell 2 x 10 bilateral  SL reverse clamshell 2 x 10 bilateral  Hooklying TA activation + hip abduction with red loop 2 x 10 Hooklying TA activation + march with red loop 2 x 10 bilateral  Supine piriformis stretch 3 x 30 sec Hip internal/ external rotation stretch x 10 5 sec hold Vasopneumatic device three snow flakes medium compression x 15 mins  07/08/2024 Nustep x 5 min level 3 IT band stretch 3 x 30 sec left Supine piriformis stretch 3 x 30 sec Side lying IT band rolling and manual STM Trigger Point Dry Needling Initial Treatment: Pt instructed on Dry Needling rational, procedures, and possible side effects. Pt instructed to expect mild to moderate muscle soreness later in the day and/or into the next day.  Pt instructed in methods to reduce muscle soreness. Pt instructed to continue prescribed HEP. Because Dry Needling was performed over or adjacent to a lung field, pt was educated on S/S of pneumothorax and to seek immediate medical attention should they occur.  Patient was educated on signs and symptoms of infection and other risk factors and advised to seek medical attention should they  occur.  Patient verbalized understanding of these instructions and education.  Patient Verbal Consent Given: Yes Education Handout Provided: Yes Muscles Treated: Glut max, min and med, piriformis, lateral quad Electrical Stimulation Performed: No Treatment Response/Outcome: Skilled palpation used to identify taut bands and trigger points.  Once identified, dry needling techniques used to treat these areas.  Twitch response ellicited along with palpable elongation of muscle.  Following treatment, patient reported reduction in overall pain and less muscle tension at the lateral left LE.   06/28/24  Initial eval completed and initiated HEP   PATIENT EDUCATION:  Education details: Initiated HEP Person educated: Patient Education method: Programmer, multimedia, Facilities manager, Verbal cues, and Handouts Education comprehension: verbalized understanding, returned demonstration, and verbal cues required  HOME EXERCISE PROGRAM: Access Code: [email or web address] URL: [email or web address] Date: 07/12/2024 Prepared by: Kristeen Sar  Exercises - Supine ITB Stretch with Strap  - 1 x daily - 7 x weekly - 1 sets - 3 reps - 30 sec hold - Supine Piriformis Stretch with Foot on Ground  - 1 x daily - 7 x weekly - 1 sets - 3 reps - 30 sec hold - Sidelying IT Band Foam Roll Mobilization  - 1 x daily - 7 x weekly - 3 sets - 10 reps - Active Straight Leg Raise with Quad Set  - 1 x daily - 7 x weekly - 2 sets - 10 reps - Supine Bridge  - 1 x daily - 7 x weekly - 1-2 sets - 10 reps - Seated Long Arc Quad  - 1 x daily - 7 x weekly - 2 sets - 10 reps - Supine Hip Internal and External Rotation  - 1 x daily - 7 x weekly - 1 sets - 10 reps - 5 hold - Hooklying Clamshell with Resistance  - 1 x daily - 7 x weekly - 2 sets - 10 reps - Clamshell  - 1 x daily - 7 x weekly - 2 sets - 10 reps - Sidelying Reverse Clamshell  - 1 x daily - 7 x weekly - 2 sets - 10 reps  ASSESSMENT:  CLINICAL IMPRESSION: Sharon Robbins has MRI  scheduled.  She is better since reducing her activity level.  We focused on isolated quad strengthening today. She did well with all exercises.  She initially thought she could not do heavy weight on LAQ but she had improper technique.  Once educated on proper technique, she was able to do the 6# weight with only minor fatigue.  She is backing down on her activity and will have the MRI.    Patient will benefit from skilled PT to address the below impairments and improve overall function.  OBJECTIVE IMPAIRMENTS: difficulty walking, decreased strength, increased edema, increased fascial restrictions, increased muscle spasms, impaired flexibility, postural dysfunction, and pain.   ACTIVITY LIMITATIONS: bending, standing, squatting, stairs, and transfers  PARTICIPATION LIMITATIONS: meal prep, cleaning, laundry, driving, shopping, community activity, and yard work  PERSONAL FACTORS: 1-2 comorbidities: previous menisectomy are also affecting patient's functional outcome.   REHAB POTENTIAL: Good  CLINICAL DECISION MAKING: Stable/uncomplicated  EVALUATION COMPLEXITY: Low   GOALS: Goals reviewed with patient? Yes  SHORT TERM GOALS: Target date: 07/26/2024  Patient will be independent with initial HEP  Baseline: Goal status: MET 07/08/24  2.  Pain report to be no greater than 4/10  Baseline:  Goal status: MET 07/08/24  3.  LEFS score to improve by 2 points Baseline:  Goal status: INITIAL  4.  Functional scores to improve by at least 1-2 seconds Baseline:  Goal status: INITIAL   LONG TERM GOALS: Target date: 08/23/2024  Patient to report pain no greater than 2/10  Baseline:  Goal status: INITIAL  2.  Patient to be independent with advanced HEP  Baseline:  Goal status: INITIAL  3.  Patient to be able to ascend and descend steps without pain or no greater than 2/10  Baseline:  Goal status: INITIAL  4.  Patient to be able to bend, stoop and squat with pain no greater than 2/10   Baseline:  Goal status: INITIAL  5.  Patient to be able to stand or walk for at least 15 min without leg pain  Baseline:  Goal status: INITIAL  6.  LEFS score to improve by 3 points or better Baseline:  Goal status: INITIAL  7.  Functional scores to improve by 2-3 seconds  Baseline:  Goal status: INITIAL   PLAN:  PT FREQUENCY: 1-2x/week  PT DURATION: 8 weeks  PLANNED INTERVENTIONS: 97110-Therapeutic exercises, 97530- Therapeutic activity, [id number]- Neuromuscular re-education, 97535- Self Care, 02859- Manual therapy, [id number]- Gait training, [phone]- Canalith repositioning, [id number]- Aquatic Therapy, [phone]- Electrical stimulation (unattended), [id number]- Electrical stimulation (manual), [id number]- Vasopneumatic device, [id number]- Ultrasound, [id number]- Ionotophoresis 4mg /ml Dexamethasone, 79439 (1-2 muscles), 20561 (3+ muscles)- Dry Needling, Patient/Family education, Balance training, Stair training, Taping, Joint mobilization, Vestibular training, Cryotherapy, and Moist heat  PLAN FOR NEXT SESSION: assess pain and progress accordingly; possibly continue with lower intensity exercises    Leighana Neyman B. Quinlynn Cuthbert, PT 07/14/24 5:55 PM [facility] [address], [address] [place], [place] [address] Phone # [phone] Fax [phone]

## 2024-07-18 ENCOUNTER — Encounter: Payer: Self-pay | Admitting: Physical Therapy

## 2024-07-18 ENCOUNTER — Ambulatory Visit: Payer: Self-pay | Admitting: Physical Therapy

## 2024-07-18 DIAGNOSIS — M25562 Pain in left knee: Secondary | ICD-10-CM

## 2024-07-18 DIAGNOSIS — M25662 Stiffness of left knee, not elsewhere classified: Secondary | ICD-10-CM

## 2024-07-18 DIAGNOSIS — R293 Abnormal posture: Secondary | ICD-10-CM

## 2024-07-18 DIAGNOSIS — R262 Difficulty in walking, not elsewhere classified: Secondary | ICD-10-CM

## 2024-07-18 DIAGNOSIS — R252 Cramp and spasm: Secondary | ICD-10-CM

## 2024-07-18 DIAGNOSIS — M6281 Muscle weakness (generalized): Secondary | ICD-10-CM

## 2024-07-18 NOTE — Therapy (Signed)
 OUTPATIENT PHYSICAL THERAPY LOWER EXTREMITY TREATMENT   Patient Name: Sharon Robbins MRN: [id number] DOB:1966/09/21, 58 y.o., female Today's Date: 07/18/2024  END OF SESSION:  PT End of Session - 07/18/24 1555     Visit Number 7    Date for Recertification  08/23/24    Authorization Type [employer]    Authorization Time Period No auth required    PT Start Time 1448    PT Stop Time 1532    PT Time Calculation (min) 44 min    Activity Tolerance Patient tolerated treatment well    Behavior During Therapy WFL for tasks assessed/performed               Past Medical History:  Diagnosis Date   Headache    History of kidney stones    Past Surgical History:  Procedure Laterality Date   BACK SURGERY     CESAREAN SECTION     EXTRACORPOREAL SHOCK WAVE LITHOTRIPSY Right 05/31/2018   Procedure: RIGHT EXTRACORPOREAL SHOCK WAVE LITHOTRIPSY (ESWL);  Surgeon: Matilda Senior, MD;  Location: [facility];  Service: Urology;  Laterality: Right;   Patient Active Problem List   Diagnosis Date Noted   Overweight 02/15/2014    PCP: No PCP listed  REFERRING PROVIDER: Velinda Chancy, MD  REFERRING DIAG: Left anterior knee pain/IT band  THERAPY DIAG:  Acute pain of left knee  Stiffness of left knee, not elsewhere classified  Muscle weakness (generalized)  Difficulty in walking, not elsewhere classified  Cramp and spasm  Abnormal posture  Rationale for Evaluation and Treatment: Rehabilitation  ONSET DATE: 06/16/24  SUBJECTIVE:   SUBJECTIVE STATEMENT: Patient reports she was painful over the weekend after playing pickleball on Saturday and dancing. She iced her knee and rested yesterday. MRI is scheduled for next Thursday.   From Eval: Patient had meniscus repair 03/06/23 with Dr. Chancy.  (Back fusion 2016)  She did rehab and started playing pickle ball   PERTINENT HISTORY: meniscus repair 03/06/23 with Dr. Chancy. Hx left fibular fx - non operative (2022) (Back  fusion 2016)  PAIN:  Are you having pain? Yes: NPRS scale: 0/10 currently, at worst initially 7/10, has taken a week off and its a lot better Pain location: lateral joint space Pain description: sharp/aching  Aggravating factors: pickle ball, stairs Relieving factors: ice, ibuprofen  PRECAUTIONS: None  RED FLAGS: None   WEIGHT BEARING RESTRICTIONS: No  FALLS:  Has patient fallen in last 6 months? No  LIVING ENVIRONMENT: Lives with: lives with their spouse Lives in: House/apartment Stairs: No   OCCUPATION: desk work  PLOF: Independent, Independent with basic ADLs, Independent with household mobility without device, Independent with community mobility without device, Independent with homemaking with ambulation, Independent with gait, and Independent with transfers  PATIENT GOALS: To be able to continue playing pickle ball  NEXT MD VISIT: prn  OBJECTIVE:  Note: Objective measures were completed at Evaluation unless otherwise noted.  DIAGNOSTIC FINDINGS: Patient states   PATIENT SURVEYS:  LEFS  Extreme difficulty/unable (0), Quite a bit of difficulty (1), Moderate difficulty (2), Little difficulty (3), No difficulty (4) Survey date:  06/28/24  Score total:  50/80     COGNITION: Overall cognitive status: Within functional limits for tasks assessed     SENSATION: WFL  EDEMA:  Circumferential: Left: 40 cm, right : 39 cm  MUSCLE LENGTH: Hamstrings: Right 55 deg; Left 50 deg Thomas test: Right pos; Left pos  POSTURE: No Significant postural limitations  PALPATION: Milld crepitus left knee  on open chain knee extension and flexion  LOWER EXTREMITY ROM:  WFL  LOWER EXTREMITY MMT:  Generally 4 to 4+/5 bilateral LE's  LOWER EXTREMITY SPECIAL TESTS:  Knee special tests: Held due to hx of menisectomy  FUNCTIONAL TESTS:  07/01/2024 5 times sit to stand: 9.19 sec no UE support. No knee pain Timed up and go (TUG): 6.27 sec  GAIT: Distance walked: 30  feet Assistive device utilized: None Level of assistance: Complete Independence Comments: antalgic start up                                                                                                                                TREATMENT DATE:  07/18/2024 Recumbent Bike Level 1 5 mins PT present to discuss status Supine IT band stretch 3 x 30 Lt  Prone quad stretch with green strap 3 x 20 sec bilateral  Hooklying hip internal/ external rotation x 5 sec x 8 bilateral  Supine SAQ with 4# over blue foam roll 2 x 10 Lt  Hooklyling TA activation + hip abduction with red loop 2 x 10 Hooklying TA activation + march with red loop 2 x 10 bilateral  Supine SLR 2 x 10 bilateral  Manual: Addaday to left lumbar paraspinals, left glutes, and TFL for improved muscle elongation and decreased pain.    07/14/2024 Nustep x 5 min level 5 PT present to discuss status Seated LAQ with 6# 2 x 10 both LE Supine quad set x 20 both Supine TKE with small green noodle with 6# 2 x 10 both Supine TKE with larger green noodle with 6# 2 x 10 both  Supine SAQ with 6# 2 x 10 both SLR 2 x 10 0# both Supine IT band stretch 3 x 30  Addaday to left IT band and gluteals x 5 min Educated on use of ice and proper pain control .  Vasopneumatic device three snow flakes medium compression x 15 mins  07/12/2024 Update on status. Response to DN, plan for therapy, options of returning to the MD Supine Bridge x 10  SL clamshell 2 x 10 bilateral  SL reverse clamshell 2 x 10 bilateral  Hooklying TA activation + hip abduction with red loop 2 x 10 Hooklying TA activation + march with red loop 2 x 10 bilateral  Supine piriformis stretch 3 x 30 sec Hip internal/ external rotation stretch x 10 5 sec hold Vasopneumatic device three snow flakes medium compression x 15 mins  07/08/2024 Nustep x 5 min level 3 IT band stretch 3 x 30 sec left Supine piriformis stretch 3 x 30 sec Side lying IT band rolling and manual  STM Trigger Point Dry Needling Initial Treatment: Pt instructed on Dry Needling rational, procedures, and possible side effects. Pt instructed to expect mild to moderate muscle soreness later in the day and/or into the next day.  Pt instructed in methods to reduce muscle soreness. Pt instructed to continue prescribed HEP.  Because Dry Needling was performed over or adjacent to a lung field, pt was educated on S/S of pneumothorax and to seek immediate medical attention should they occur.  Patient was educated on signs and symptoms of infection and other risk factors and advised to seek medical attention should they occur.  Patient verbalized understanding of these instructions and education.  Patient Verbal Consent Given: Yes Education Handout Provided: Yes Muscles Treated: Glut max, min and med, piriformis, lateral quad Electrical Stimulation Performed: No Treatment Response/Outcome: Skilled palpation used to identify taut bands and trigger points.  Once identified, dry needling techniques used to treat these areas.  Twitch response ellicited along with palpable elongation of muscle.  Following treatment, patient reported reduction in overall pain and less muscle tension at the lateral left LE.   06/28/24  Initial eval completed and initiated HEP   PATIENT EDUCATION:  Education details: Initiated HEP Person educated: Patient Education method: Programmer, multimedia, Facilities manager, Verbal cues, and Handouts Education comprehension: verbalized understanding, returned demonstration, and verbal cues required  HOME EXERCISE PROGRAM: Access Code: [email or web address] URL: [email or web address] Date: 07/12/2024 Prepared by: Kristeen Sar  Exercises - Supine ITB Stretch with Strap  - 1 x daily - 7 x weekly - 1 sets - 3 reps - 30 sec hold - Supine Piriformis Stretch with Foot on Ground  - 1 x daily - 7 x weekly - 1 sets - 3 reps - 30 sec hold - Sidelying IT Band Foam Roll Mobilization  - 1 x daily - 7 x  weekly - 3 sets - 10 reps - Active Straight Leg Raise with Quad Set  - 1 x daily - 7 x weekly - 2 sets - 10 reps - Supine Bridge  - 1 x daily - 7 x weekly - 1-2 sets - 10 reps - Seated Long Arc Quad  - 1 x daily - 7 x weekly - 2 sets - 10 reps - Supine Hip Internal and External Rotation  - 1 x daily - 7 x weekly - 1 sets - 10 reps - 5 hold - Hooklying Clamshell with Resistance  - 1 x daily - 7 x weekly - 2 sets - 10 reps - Clamshell  - 1 x daily - 7 x weekly - 2 sets - 10 reps - Sidelying Reverse Clamshell  - 1 x daily - 7 x weekly - 2 sets - 10 reps  ASSESSMENT:  CLINICAL IMPRESSION: Beverley verbalized increased knee soreness after last treatment session. She played pickle ball on Saturday and went to a party, which aggravated her knee. She verbalized increased pain and swelling yesterday. Treatment session focused on hip strengthening, flexibility, and knee stability. She tolerated all exercises well and did not verbalized any increased pain or discomfort. Patient verbalized she will take it easy from pickle ball until she gets her MRI next week. Patient will benefit from skilled PT to address the below impairments and improve overall function.   OBJECTIVE IMPAIRMENTS: difficulty walking, decreased strength, increased edema, increased fascial restrictions, increased muscle spasms, impaired flexibility, postural dysfunction, and pain.   ACTIVITY LIMITATIONS: bending, standing, squatting, stairs, and transfers  PARTICIPATION LIMITATIONS: meal prep, cleaning, laundry, driving, shopping, community activity, and yard work  PERSONAL FACTORS: 1-2 comorbidities: previous menisectomy are also affecting patient's functional outcome.   REHAB POTENTIAL: Good  CLINICAL DECISION MAKING: Stable/uncomplicated  EVALUATION COMPLEXITY: Low   GOALS: Goals reviewed with patient? Yes  SHORT TERM GOALS: Target date: 07/26/2024  Patient will be independent with initial HEP  Baseline: Goal  status: MET  07/08/24  2.  Pain report to be no greater than 4/10  Baseline:  Goal status: MET 07/08/24  3.  LEFS score to improve by 2 points Baseline:  Goal status: INITIAL  4.  Functional scores to improve by at least 1-2 seconds Baseline:  Goal status: INITIAL   LONG TERM GOALS: Target date: 08/23/2024  Patient to report pain no greater than 2/10  Baseline:  Goal status: INITIAL  2.  Patient to be independent with advanced HEP  Baseline:  Goal status: INITIAL  3.  Patient to be able to ascend and descend steps without pain or no greater than 2/10  Baseline:  Goal status: INITIAL  4.  Patient to be able to bend, stoop and squat with pain no greater than 2/10  Baseline:  Goal status: INITIAL  5.  Patient to be able to stand or walk for at least 15 min without leg pain  Baseline:  Goal status: INITIAL  6.  LEFS score to improve by 3 points or better Baseline:  Goal status: INITIAL  7.  Functional scores to improve by 2-3 seconds  Baseline:  Goal status: INITIAL   PLAN:  PT FREQUENCY: 1-2x/week  PT DURATION: 8 weeks  PLANNED INTERVENTIONS: 97110-Therapeutic exercises, 97530- Therapeutic activity, 97112- Neuromuscular re-education, 97535- Self Care, 02859- Manual therapy, [phone]- Gait training, [phone]- Canalith repositioning, [id number]- Aquatic Therapy, [phone]- Electrical stimulation (unattended), [phone]- Electrical stimulation (manual), [id number]- Vasopneumatic device, [id number]- Ultrasound, [id number]- Ionotophoresis 4mg /ml Dexamethasone, 20560 (1-2 muscles), 20561 (3+ muscles)- Dry Needling, Patient/Family education, Balance training, Stair training, Taping, Joint mobilization, Vestibular training, Cryotherapy, and Moist heat  PLAN FOR NEXT SESSION: assess pain and progress accordingly; possibly continue with lower intensity exercises    Kristeen Sar, PT 07/18/24 3:56 PM [facility] [address], [address] [place], [place] [address] Phone # [phone] Fax  [phone]

## 2024-07-19 ENCOUNTER — Ambulatory Visit: Payer: Self-pay

## 2024-07-27 ENCOUNTER — Telehealth: Payer: Self-pay | Admitting: Physical Therapy

## 2024-07-27 ENCOUNTER — Ambulatory Visit

## 2024-07-27 NOTE — Telephone Encounter (Signed)
 PT called Pt about missed appointment on 07/27/24 at 9:30am.  Left a VM requesting she call the office back.  Darcell Yacoub, PT 07/27/24 9:49 AM

## 2024-07-27 NOTE — Therapy (Incomplete)
 OUTPATIENT PHYSICAL THERAPY LOWER EXTREMITY TREATMENT   Patient Name: Sharon Robbins MRN: [id number] DOB:1966/07/20, 58 y.o., female Today's Date: 07/27/2024  END OF SESSION:         Past Medical History:  Diagnosis Date   Headache    History of kidney stones    Past Surgical History:  Procedure Laterality Date   BACK SURGERY     CESAREAN SECTION     EXTRACORPOREAL SHOCK WAVE LITHOTRIPSY Right 05/31/2018   Procedure: RIGHT EXTRACORPOREAL SHOCK WAVE LITHOTRIPSY (ESWL);  Surgeon: Matilda Senior, MD;  Location: [facility];  Service: Urology;  Laterality: Right;   Patient Active Problem List   Diagnosis Date Noted   Overweight 02/15/2014    PCP: No PCP listed  REFERRING PROVIDER: Velinda Chancy, MD  REFERRING DIAG: Left anterior knee pain/IT band  THERAPY DIAG:  No diagnosis found.  Rationale for Evaluation and Treatment: Rehabilitation  ONSET DATE: 06/16/24  SUBJECTIVE:   SUBJECTIVE STATEMENT: Patient reports she was painful over the weekend after playing pickleball on Saturday and dancing. She iced her knee and rested yesterday. MRI is scheduled for next Thursday.   From Eval: Patient had meniscus repair 03/06/23 with Dr. Chancy.  (Back fusion 2016)  She did rehab and started playing pickle ball   PERTINENT HISTORY: meniscus repair 03/06/23 with Dr. Chancy. Hx left fibular fx - non operative (2022) (Back fusion 2016)  PAIN:  Are you having pain? Yes: NPRS scale: 0/10 currently, at worst initially 7/10, has taken a week off and its a lot better Pain location: lateral joint space Pain description: sharp/aching  Aggravating factors: pickle ball, stairs Relieving factors: ice, ibuprofen  PRECAUTIONS: None  RED FLAGS: None   WEIGHT BEARING RESTRICTIONS: No  FALLS:  Has patient fallen in last 6 months? No  LIVING ENVIRONMENT: Lives with: lives with their spouse Lives in: House/apartment Stairs: No   OCCUPATION: desk work  PLOF: Independent,  Independent with basic ADLs, Independent with household mobility without device, Independent with community mobility without device, Independent with homemaking with ambulation, Independent with gait, and Independent with transfers  PATIENT GOALS: To be able to continue playing pickle ball  NEXT MD VISIT: prn  OBJECTIVE:  Note: Objective measures were completed at Evaluation unless otherwise noted.  DIAGNOSTIC FINDINGS: Patient states   PATIENT SURVEYS:  LEFS  Extreme difficulty/unable (0), Quite a bit of difficulty (1), Moderate difficulty (2), Little difficulty (3), No difficulty (4) Survey date:  06/28/24  Score total:  50/80     COGNITION: Overall cognitive status: Within functional limits for tasks assessed     SENSATION: WFL  EDEMA:  Circumferential: Left: 40 cm, right : 39 cm  MUSCLE LENGTH: Hamstrings: Right 55 deg; Left 50 deg Thomas test: Right pos; Left pos  POSTURE: No Significant postural limitations  PALPATION: Milld crepitus left knee on open chain knee extension and flexion  LOWER EXTREMITY ROM:  WFL  LOWER EXTREMITY MMT:  Generally 4 to 4+/5 bilateral LE's  LOWER EXTREMITY SPECIAL TESTS:  Knee special tests: Held due to hx of menisectomy  FUNCTIONAL TESTS:  07/01/2024 5 times sit to stand: 9.19 sec no UE support. No knee pain Timed up and go (TUG): 6.27 sec  GAIT: Distance walked: 30 feet Assistive device utilized: None Level of assistance: Complete Independence Comments: antalgic start up  TREATMENT DATE:  07/27/24   07/18/2024 Recumbent Bike Level 1 5 mins PT present to discuss status Supine IT band stretch 3 x 30 Lt  Prone quad stretch with green strap 3 x 20 sec bilateral  Hooklying hip internal/ external rotation x 5 sec x 8 bilateral  Supine SAQ with 4# over blue foam roll 2 x 10 Lt  Hooklyling TA  activation + hip abduction with red loop 2 x 10 Hooklying TA activation + march with red loop 2 x 10 bilateral  Supine SLR 2 x 10 bilateral  Manual: Addaday to left lumbar paraspinals, left glutes, and TFL for improved muscle elongation and decreased pain.    07/14/2024 Nustep x 5 min level 5 PT present to discuss status Seated LAQ with 6# 2 x 10 both LE Supine quad set x 20 both Supine TKE with small green noodle with 6# 2 x 10 both Supine TKE with larger green noodle with 6# 2 x 10 both  Supine SAQ with 6# 2 x 10 both SLR 2 x 10 0# both Supine IT band stretch 3 x 30  Addaday to left IT band and gluteals x 5 min Educated on use of ice and proper pain control .  Vasopneumatic device three snow flakes medium compression x 15 mins  07/12/2024 Update on status. Response to DN, plan for therapy, options of returning to the MD Supine Bridge x 10  SL clamshell 2 x 10 bilateral  SL reverse clamshell 2 x 10 bilateral  Hooklying TA activation + hip abduction with red loop 2 x 10 Hooklying TA activation + march with red loop 2 x 10 bilateral  Supine piriformis stretch 3 x 30 sec Hip internal/ external rotation stretch x 10 5 sec hold Vasopneumatic device three snow flakes medium compression x 15 mins  07/08/2024 Nustep x 5 min level 3 IT band stretch 3 x 30 sec left Supine piriformis stretch 3 x 30 sec Side lying IT band rolling and manual STM Trigger Point Dry Needling Initial Treatment: Pt instructed on Dry Needling rational, procedures, and possible side effects. Pt instructed to expect mild to moderate muscle soreness later in the day and/or into the next day.  Pt instructed in methods to reduce muscle soreness. Pt instructed to continue prescribed HEP. Because Dry Needling was performed over or adjacent to a lung field, pt was educated on S/S of pneumothorax and to seek immediate medical attention should they occur.  Patient was educated on signs and symptoms of infection and other  risk factors and advised to seek medical attention should they occur.  Patient verbalized understanding of these instructions and education.  Patient Verbal Consent Given: Yes Education Handout Provided: Yes Muscles Treated: Glut max, min and med, piriformis, lateral quad Electrical Stimulation Performed: No Treatment Response/Outcome: Skilled palpation used to identify taut bands and trigger points.  Once identified, dry needling techniques used to treat these areas.  Twitch response ellicited along with palpable elongation of muscle.  Following treatment, patient reported reduction in overall pain and less muscle tension at the lateral left LE.   06/28/24  Initial eval completed and initiated HEP   PATIENT EDUCATION:  Education details: Initiated HEP Person educated: Patient Education method: Programmer, multimedia, Facilities manager, Verbal cues, and Handouts Education comprehension: verbalized understanding, returned demonstration, and verbal cues required  HOME EXERCISE PROGRAM: Access Code: [email or web address] URL: [email or web address] Date: 07/12/2024 Prepared by: Kristeen Sar  Exercises - Supine ITB Stretch with Strap  - 1 x daily - 7 x weekly -  1 sets - 3 reps - 30 sec hold - Supine Piriformis Stretch with Foot on Ground  - 1 x daily - 7 x weekly - 1 sets - 3 reps - 30 sec hold - Sidelying IT Band Foam Roll Mobilization  - 1 x daily - 7 x weekly - 3 sets - 10 reps - Active Straight Leg Raise with Quad Set  - 1 x daily - 7 x weekly - 2 sets - 10 reps - Supine Bridge  - 1 x daily - 7 x weekly - 1-2 sets - 10 reps - Seated Long Arc Quad  - 1 x daily - 7 x weekly - 2 sets - 10 reps - Supine Hip Internal and External Rotation  - 1 x daily - 7 x weekly - 1 sets - 10 reps - 5 hold - Hooklying Clamshell with Resistance  - 1 x daily - 7 x weekly - 2 sets - 10 reps - Clamshell  - 1 x daily - 7 x weekly - 2 sets - 10 reps - Sidelying Reverse Clamshell  - 1 x daily - 7 x weekly - 2 sets - 10  reps  ASSESSMENT:  CLINICAL IMPRESSION: Sharon Robbins verbalized increased knee soreness after last treatment session. She played pickle ball on Saturday and went to a party, which aggravated her knee. She verbalized increased pain and swelling yesterday. Treatment session focused on hip strengthening, flexibility, and knee stability. She tolerated all exercises well and did not verbalized any increased pain or discomfort. Patient verbalized she will take it easy from pickle ball until she gets her MRI next week. Patient will benefit from skilled PT to address the below impairments and improve overall function.   OBJECTIVE IMPAIRMENTS: difficulty walking, decreased strength, increased edema, increased fascial restrictions, increased muscle spasms, impaired flexibility, postural dysfunction, and pain.   ACTIVITY LIMITATIONS: bending, standing, squatting, stairs, and transfers  PARTICIPATION LIMITATIONS: meal prep, cleaning, laundry, driving, shopping, community activity, and yard work  PERSONAL FACTORS: 1-2 comorbidities: previous menisectomy are also affecting patient's functional outcome.   REHAB POTENTIAL: Good  CLINICAL DECISION MAKING: Stable/uncomplicated  EVALUATION COMPLEXITY: Low   GOALS: Goals reviewed with patient? Yes  SHORT TERM GOALS: Target date: 07/26/2024  Patient will be independent with initial HEP  Baseline: Goal status: MET 07/08/24  2.  Pain report to be no greater than 4/10  Baseline:  Goal status: MET 07/08/24  3.  LEFS score to improve by 2 points Baseline:  Goal status: INITIAL  4.  Functional scores to improve by at least 1-2 seconds Baseline:  Goal status: INITIAL   LONG TERM GOALS: Target date: 08/23/2024  Patient to report pain no greater than 2/10  Baseline:  Goal status: INITIAL  2.  Patient to be independent with advanced HEP  Baseline:  Goal status: INITIAL  3.  Patient to be able to ascend and descend steps without pain or no greater than  2/10  Baseline:  Goal status: INITIAL  4.  Patient to be able to bend, stoop and squat with pain no greater than 2/10  Baseline:  Goal status: INITIAL  5.  Patient to be able to stand or walk for at least 15 min without leg pain  Baseline:  Goal status: INITIAL  6.  LEFS score to improve by 3 points or better Baseline:  Goal status: INITIAL  7.  Functional scores to improve by 2-3 seconds  Baseline:  Goal status: INITIAL   PLAN:  PT FREQUENCY: 1-2x/week  PT DURATION: 8 weeks  PLANNED INTERVENTIONS: 97110-Therapeutic exercises, 97530- Therapeutic activity, 97112- Neuromuscular re-education, [phone]- Self Care, 02859- Manual therapy, [phone]- Gait training, [id number]- Canalith repositioning, [phone]- Aquatic Therapy, [phone]- Electrical stimulation (unattended), [phone]- Electrical stimulation (manual), [id number]- Vasopneumatic device, [id number]- Ultrasound, [id number]- Ionotophoresis 4mg /ml Dexamethasone, 20560 (1-2 muscles), 20561 (3+ muscles)- Dry Needling, Patient/Family education, Balance training, Stair training, Taping, Joint mobilization, Vestibular training, Cryotherapy, and Moist heat  PLAN FOR NEXT SESSION: assess pain and progress accordingly; possibly continue with lower intensity exercises    Kristeen Sar, PT 07/27/24 7:55 AM [facility] [address], [address] [place], [place] [address] Phone # [phone] Fax [phone]

## 2024-07-28 ENCOUNTER — Ambulatory Visit: Payer: Self-pay | Admitting: Physical Therapy

## 2024-07-28 ENCOUNTER — Encounter: Payer: Self-pay | Admitting: Physical Therapy

## 2024-07-28 DIAGNOSIS — R262 Difficulty in walking, not elsewhere classified: Secondary | ICD-10-CM

## 2024-07-28 DIAGNOSIS — M25562 Pain in left knee: Secondary | ICD-10-CM

## 2024-07-28 DIAGNOSIS — M6281 Muscle weakness (generalized): Secondary | ICD-10-CM

## 2024-07-28 DIAGNOSIS — M25662 Stiffness of left knee, not elsewhere classified: Secondary | ICD-10-CM

## 2024-07-28 NOTE — Therapy (Signed)
 OUTPATIENT PHYSICAL THERAPY LOWER EXTREMITY TREATMENT   Patient Name: Sharon Robbins MRN: [id number] DOB:02/16/1966, 58 y.o., female Today's Date: 07/28/2024  END OF SESSION:  PT End of Session - 07/28/24 1454     Visit Number 8    Date for Recertification  08/23/24    Authorization Type Aetna [place] Preferred    Authorization Time Period No auth required    PT Start Time 1450    PT Stop Time 1531    PT Time Calculation (min) 41 min    Activity Tolerance Patient tolerated treatment well    Behavior During Therapy WFL for tasks assessed/performed                Past Medical History:  Diagnosis Date   Headache    History of kidney stones    Past Surgical History:  Procedure Laterality Date   BACK SURGERY     CESAREAN SECTION     EXTRACORPOREAL SHOCK WAVE LITHOTRIPSY Right 05/31/2018   Procedure: RIGHT EXTRACORPOREAL SHOCK WAVE LITHOTRIPSY (ESWL);  Surgeon: Matilda Senior, MD;  Location: [facility];  Service: Urology;  Laterality: Right;   Patient Active Problem List   Diagnosis Date Noted   Overweight 02/15/2014    PCP: No PCP listed  REFERRING PROVIDER: Velinda Chancy, MD  REFERRING DIAG: Left anterior knee pain/IT band  THERAPY DIAG:  Acute pain of left knee  Stiffness of left knee, not elsewhere classified  Muscle weakness (generalized)  Difficulty in walking, not elsewhere classified  Rationale for Evaluation and Treatment: Rehabilitation  ONSET DATE: 06/16/24  SUBJECTIVE:   SUBJECTIVE STATEMENT: My MRI is today after my appointment. I was traveling and my back went out - I have a history of a bad back with a fusion, etc - I got a massage which helped a little bit and have requested a prednison pack.  Pain was an 8/10 and now is a 5/10 - it has gotten a little better with ice, patch, rest etc.  From Eval: Patient had meniscus repair 03/06/23 with Dr. Chancy.  (Back fusion 2016)  She did rehab and started playing pickle ball   PERTINENT  HISTORY: meniscus repair 03/06/23 with Dr. Chancy. Hx left fibular fx - non operative (2022) (Back fusion 2016)  PAIN:  Are you having pain? Yes: NPRS scale: 0/10 currently, at worst initially 7/10, has taken a week off and its a lot better Pain location: lateral joint space Pain description: sharp/aching  Aggravating factors: pickle ball, stairs Relieving factors: ice, ibuprofen  PRECAUTIONS: None  RED FLAGS: None   WEIGHT BEARING RESTRICTIONS: No  FALLS:  Has patient fallen in last 6 months? No  LIVING ENVIRONMENT: Lives with: lives with their spouse Lives in: House/apartment Stairs: No   OCCUPATION: desk work  PLOF: Independent, Independent with basic ADLs, Independent with household mobility without device, Independent with community mobility without device, Independent with homemaking with ambulation, Independent with gait, and Independent with transfers  PATIENT GOALS: To be able to continue playing pickle ball  NEXT MD VISIT: prn  OBJECTIVE:  Note: Objective measures were completed at Evaluation unless otherwise noted.  DIAGNOSTIC FINDINGS: Patient states   PATIENT SURVEYS:  LEFS  Extreme difficulty/unable (0), Quite a bit of difficulty (1), Moderate difficulty (2), Little difficulty (3), No difficulty (4) Survey date:  06/28/24  Score total:  50/80     COGNITION: Overall cognitive status: Within functional limits for tasks assessed     SENSATION: WFL  EDEMA:  Circumferential: Left: 40 cm, right :  39 cm  MUSCLE LENGTH: Hamstrings: Right 55 deg; Left 50 deg Thomas test: Right pos; Left pos  POSTURE: No Significant postural limitations  PALPATION: Milld crepitus left knee on open chain knee extension and flexion  LOWER EXTREMITY ROM:  WFL  LOWER EXTREMITY MMT:  Generally 4 to 4+/5 bilateral LE's  LOWER EXTREMITY SPECIAL TESTS:  Knee special tests: Held due to hx of menisectomy  FUNCTIONAL TESTS:  07/01/2024 5 times sit to stand: 9.19 sec  no UE support. No knee pain Timed up and go (TUG): 6.27 sec  GAIT: Distance walked: 30 feet Assistive device utilized: None Level of assistance: Complete Independence Comments: antalgic start up                                                                                                                                TREATMENT DATE:  07/28/24 Recumbent bike L2 x 5' PT present to discuss status Supine SAQ 3lb Lt with ball squeeze 2x10 Supine SLR 2x10 bil (from bolster, SAQ to SLR to support lumbar spine) Supine hooklying march with exhale and TA indraw with red loop at knees 2x10 Supine bil heel dig into red physioball roll ball in and out 2x10 Supine bridge with heels on ball x 5 Glut bridge hooklying with red hip abd x10 Review of gastroc vs soleus stretch Manual: Addaday to left lumbar paraspinals, left glutes, and TFL/ITB for improved muscle elongation and decreased pain.  07/18/2024 Recumbent Bike Level 1 5 mins PT present to discuss status Supine IT band stretch 3 x 30 Lt  Prone quad stretch with green strap 3 x 20 sec bilateral  Hooklying hip internal/ external rotation x 5 sec x 8 bilateral  Supine SAQ with 4# over blue foam roll 2 x 10 Lt  Hooklyling TA activation + hip abduction with red loop 2 x 10 Hooklying TA activation + march with red loop 2 x 10 bilateral  Supine SLR 2 x 10 bilateral  Manual: Addaday to left lumbar paraspinals, left glutes, and TFL for improved muscle elongation and decreased pain.  07/14/2024 Nustep x 5 min level 5 PT present to discuss status Seated LAQ with 6# 2 x 10 both LE Supine quad set x 20 both Supine TKE with small green noodle with 6# 2 x 10 both Supine TKE with larger green noodle with 6# 2 x 10 both  Supine SAQ with 6# 2 x 10 both SLR 2 x 10 0# both Supine IT band stretch 3 x 30  Addaday to left IT band and gluteals x 5 min Educated on use of ice and proper pain control .  Vasopneumatic device three snow flakes medium  compression x 15 mins   PATIENT EDUCATION:  Education details: Initiated HEP Person educated: Patient Education method: Programmer, multimedia, Facilities manager, Verbal cues, and Handouts Education comprehension: verbalized understanding, returned demonstration, and verbal cues required  HOME EXERCISE PROGRAM: Access Code: [email or web address] URL: [email or web address] Date: 07/12/2024 Prepared by:  Ciera Evans  Exercises - Supine ITB Stretch with Strap  - 1 x daily - 7 x weekly - 1 sets - 3 reps - 30 sec hold - Supine Piriformis Stretch with Foot on Ground  - 1 x daily - 7 x weekly - 1 sets - 3 reps - 30 sec hold - Sidelying IT Band Foam Roll Mobilization  - 1 x daily - 7 x weekly - 3 sets - 10 reps - Active Straight Leg Raise with Quad Set  - 1 x daily - 7 x weekly - 2 sets - 10 reps - Supine Bridge  - 1 x daily - 7 x weekly - 1-2 sets - 10 reps - Seated Long Arc Quad  - 1 x daily - 7 x weekly - 2 sets - 10 reps - Supine Hip Internal and External Rotation  - 1 x daily - 7 x weekly - 1 sets - 10 reps - 5 hold - Hooklying Clamshell with Resistance  - 1 x daily - 7 x weekly - 2 sets - 10 reps - Clamshell  - 1 x daily - 7 x weekly - 2 sets - 10 reps - Sidelying Reverse Clamshell  - 1 x daily - 7 x weekly - 2 sets - 10 reps  ASSESSMENT:  CLINICAL IMPRESSION: Beverley reported her back went out while traveling in [place] last week.  Was an 8/10 but down to 5/10.  We modified therex to maintaining a supine position and Pt was able to do challenging exercises for core and LE without exacerbation of pain.  She is getting an MRI today for ongoing Lt knee pain.  She continues to benefit from Addaday to used for STM along Lt lumbar, hip and lateral thigh today.     OBJECTIVE IMPAIRMENTS: difficulty walking, decreased strength, increased edema, increased fascial restrictions, increased muscle spasms, impaired flexibility, postural dysfunction, and pain.   ACTIVITY LIMITATIONS: bending, standing, squatting, stairs,  and transfers  PARTICIPATION LIMITATIONS: meal prep, cleaning, laundry, driving, shopping, community activity, and yard work  PERSONAL FACTORS: 1-2 comorbidities: previous menisectomy are also affecting patient's functional outcome.   REHAB POTENTIAL: Good  CLINICAL DECISION MAKING: Stable/uncomplicated  EVALUATION COMPLEXITY: Low   GOALS: Goals reviewed with patient? Yes  SHORT TERM GOALS: Target date: 07/26/2024  Patient will be independent with initial HEP  Baseline: Goal status: MET 07/08/24  2.  Pain report to be no greater than 4/10  Baseline:  Goal status: MET 07/08/24  3.  LEFS score to improve by 2 points Baseline:  Goal status: INITIAL  4.  Functional scores to improve by at least 1-2 seconds Baseline:  Goal status: INITIAL   LONG TERM GOALS: Target date: 08/23/2024  Patient to report pain no greater than 2/10  Baseline:  Goal status: INITIAL  2.  Patient to be independent with advanced HEP  Baseline:  Goal status: INITIAL  3.  Patient to be able to ascend and descend steps without pain or no greater than 2/10  Baseline:  Goal status: INITIAL  4.  Patient to be able to bend, stoop and squat with pain no greater than 2/10  Baseline:  Goal status: INITIAL  5.  Patient to be able to stand or walk for at least 15 min without leg pain  Baseline:  Goal status: INITIAL  6.  LEFS score to improve by 3 points or better Baseline:  Goal status: INITIAL  7.  Functional scores to improve by 2-3 seconds  Baseline:  Goal  status: INITIAL   PLAN:  PT FREQUENCY: 1-2x/week  PT DURATION: 8 weeks  PLANNED INTERVENTIONS: 97110-Therapeutic exercises, 97530- Therapeutic activity, 97112- Neuromuscular re-education, [phone]- Self Care, 02859- Manual therapy, [phone]- Gait training, [phone]- Canalith repositioning, [id number]- Aquatic Therapy, [id number]- Electrical stimulation (unattended), [phone]- Electrical stimulation (manual), [id number]- Vasopneumatic device, [id number]- Ultrasound,  [id number]- Ionotophoresis 4mg /ml Dexamethasone, 20560 (1-2 muscles), 20561 (3+ muscles)- Dry Needling, Patient/Family education, Balance training, Stair training, Taping, Joint mobilization, Vestibular training, Cryotherapy, and Moist heat  PLAN FOR NEXT SESSION: assess pain and progress accordingly; possibly continue with lower intensity exercises    Orvil Fester, PT 07/28/24 5:22 PM  [facility] [address], [address] [place], [place] [address] Phone # [phone] Fax [phone]

## 2024-07-29 ENCOUNTER — Ambulatory Visit

## 2024-07-29 DIAGNOSIS — R293 Abnormal posture: Secondary | ICD-10-CM

## 2024-07-29 DIAGNOSIS — M6281 Muscle weakness (generalized): Secondary | ICD-10-CM

## 2024-07-29 DIAGNOSIS — R262 Difficulty in walking, not elsewhere classified: Secondary | ICD-10-CM

## 2024-07-29 DIAGNOSIS — R252 Cramp and spasm: Secondary | ICD-10-CM

## 2024-07-29 DIAGNOSIS — M25562 Pain in left knee: Secondary | ICD-10-CM | POA: Diagnosis not present

## 2024-07-29 DIAGNOSIS — M25662 Stiffness of left knee, not elsewhere classified: Secondary | ICD-10-CM

## 2024-07-29 NOTE — Therapy (Signed)
 OUTPATIENT PHYSICAL THERAPY LOWER EXTREMITY TREATMENT   Patient Name: Sharon Robbins MRN: [id number] DOB:12/23/1965, 58 y.o., female Today's Date: 07/29/2024  END OF SESSION:  PT End of Session - 07/29/24 1019     Visit Number 9    Date for Recertification  08/23/24    Authorization Type Aetna [place] Preferred    Authorization Time Period No auth required    PT Start Time 1021    PT Stop Time 1048    PT Time Calculation (min) 27 min    Activity Tolerance Patient tolerated treatment well    Behavior During Therapy WFL for tasks assessed/performed                Past Medical History:  Diagnosis Date   Headache    History of kidney stones    Past Surgical History:  Procedure Laterality Date   BACK SURGERY     CESAREAN SECTION     EXTRACORPOREAL SHOCK WAVE LITHOTRIPSY Right 05/31/2018   Procedure: RIGHT EXTRACORPOREAL SHOCK WAVE LITHOTRIPSY (ESWL);  Surgeon: Matilda Senior, MD;  Location: [facility];  Service: Urology;  Laterality: Right;   Patient Active Problem List   Diagnosis Date Noted   Overweight 02/15/2014    PCP: No PCP listed  REFERRING PROVIDER: Velinda Chancy, MD  REFERRING DIAG: Left anterior knee pain/IT band  THERAPY DIAG:  Acute pain of left knee  Stiffness of left knee, not elsewhere classified  Muscle weakness (generalized)  Difficulty in walking, not elsewhere classified  Cramp and spasm  Abnormal posture  Rationale for Evaluation and Treatment: Rehabilitation  ONSET DATE: 06/16/24  SUBJECTIVE:   SUBJECTIVE STATEMENT: Patient reports that provider called in prednisone dose pack for back pain and she will pick that up today.  She had MRI yesterday but has not seen or heard about results.    From Eval: Patient had meniscus repair 03/06/23 with Dr. Chancy.  (Back fusion 2016)  She did rehab and started playing pickle ball   PERTINENT HISTORY: meniscus repair 03/06/23 with Dr. Chancy. Hx left fibular fx - non operative (2022) (Back  fusion 2016)  PAIN:  07/29/24 Are you having pain? Yes: NPRS scale: back 3/10 with 3 ibuprofen, knee 2/10 Pain location: lateral joint space Pain description: sharp/aching  Aggravating factors: pickle ball, stairs Relieving factors: ice, ibuprofen  PRECAUTIONS: None  RED FLAGS: None   WEIGHT BEARING RESTRICTIONS: No  FALLS:  Has patient fallen in last 6 months? No  LIVING ENVIRONMENT: Lives with: lives with their spouse Lives in: House/apartment Stairs: No   OCCUPATION: desk work  PLOF: Independent, Independent with basic ADLs, Independent with household mobility without device, Independent with community mobility without device, Independent with homemaking with ambulation, Independent with gait, and Independent with transfers  PATIENT GOALS: To be able to continue playing pickle ball  NEXT MD VISIT: prn  OBJECTIVE:  Note: Objective measures were completed at Evaluation unless otherwise noted.  DIAGNOSTIC FINDINGS: Patient states   PATIENT SURVEYS:  LEFS  Extreme difficulty/unable (0), Quite a bit of difficulty (1), Moderate difficulty (2), Little difficulty (3), No difficulty (4) Survey date:  06/28/24  Score total:  50/80     COGNITION: Overall cognitive status: Within functional limits for tasks assessed     SENSATION: WFL  EDEMA:  Circumferential: Left: 40 cm, right : 39 cm  MUSCLE LENGTH: Hamstrings: Right 55 deg; Left 50 deg Thomas test: Right pos; Left pos  POSTURE: No Significant postural limitations  PALPATION: Milld crepitus left knee on open  chain knee extension and flexion  LOWER EXTREMITY ROM:  WFL  LOWER EXTREMITY MMT:  Generally 4 to 4+/5 bilateral LE's  LOWER EXTREMITY SPECIAL TESTS:  Knee special tests: Held due to hx of menisectomy  FUNCTIONAL TESTS:  07/01/2024 5 times sit to stand: 9.19 sec no UE support. No knee pain Timed up and go (TUG): 6.27 sec  GAIT: Distance walked: 30 feet Assistive device utilized: None Level  of assistance: Complete Independence Comments: antalgic start up                                                                                                                                TREATMENT DATE:  07/29/24 Recumbent bike L2 x 5' PT present to discuss status Supine SAQ 3lb Lt with ball squeeze 2x10 Supine SLR 2x10 bil (from bolster, SAQ to SLR to support lumbar spine) Supine hooklying march with exhale and TA indraw with red loop at knees 2x10 Supine bil heel dig into red physioball roll ball in and out 2x10 (Patient mentioned that she had a meeting at 11 so we needed to finish a little early) Manual: Addaday to left lumbar paraspinals, left glutes, and TFL/ITB for improved muscle elongation and decreased pain.  07/28/24 Recumbent bike L2 x 5' PT present to discuss status Supine SAQ 3lb Lt with ball squeeze 2x10 Supine SLR 2x10 bil (from bolster, SAQ to SLR to support lumbar spine) Supine hooklying march with exhale and TA indraw with red loop at knees 2x10 Supine bil heel dig into red physioball roll ball in and out 2x10 Supine bridge with heels on ball x 5 Glut bridge hooklying with red hip abd x10 Review of gastroc vs soleus stretch Manual: Addaday to left lumbar paraspinals, left glutes, and TFL/ITB for improved muscle elongation and decreased pain.  07/18/2024 Recumbent Bike Level 1 5 mins PT present to discuss status Supine IT band stretch 3 x 30 Lt  Prone quad stretch with green strap 3 x 20 sec bilateral  Hooklying hip internal/ external rotation x 5 sec x 8 bilateral  Supine SAQ with 4# over blue foam roll 2 x 10 Lt  Hooklyling TA activation + hip abduction with red loop 2 x 10 Hooklying TA activation + march with red loop 2 x 10 bilateral  Supine SLR 2 x 10 bilateral  Manual: Addaday to left lumbar paraspinals, left glutes, and TFL for improved muscle elongation and decreased pain.   PATIENT EDUCATION:  Education details: Initiated HEP Person educated:  Patient Education method: Programmer, multimedia, Facilities manager, Verbal cues, and Handouts Education comprehension: verbalized understanding, returned demonstration, and verbal cues required  HOME EXERCISE PROGRAM: Access Code: [email or web address] URL: [email or web address] Date: 07/12/2024 Prepared by: Kristeen Sar  Exercises - Supine ITB Stretch with Strap  - 1 x daily - 7 x weekly - 1 sets - 3 reps - 30 sec hold - Supine Piriformis Stretch with Foot on Ground  - 1 x daily - 7  x weekly - 1 sets - 3 reps - 30 sec hold - Sidelying IT Band Foam Roll Mobilization  - 1 x daily - 7 x weekly - 3 sets - 10 reps - Active Straight Leg Raise with Quad Set  - 1 x daily - 7 x weekly - 2 sets - 10 reps - Supine Bridge  - 1 x daily - 7 x weekly - 1-2 sets - 10 reps - Seated Long Arc Quad  - 1 x daily - 7 x weekly - 2 sets - 10 reps - Supine Hip Internal and External Rotation  - 1 x daily - 7 x weekly - 1 sets - 10 reps - 5 hold - Hooklying Clamshell with Resistance  - 1 x daily - 7 x weekly - 2 sets - 10 reps - Clamshell  - 1 x daily - 7 x weekly - 2 sets - 10 reps - Sidelying Reverse Clamshell  - 1 x daily - 7 x weekly - 2 sets - 10 reps  ASSESSMENT:  CLINICAL IMPRESSION: Sue's back pain was a little better today but provider has called in dose pack for her and this should further improve her symptoms.  Her knee pain is manageable right now.  We repeated the same treatment regimen as yesterday as this seemed to have helped with the back and knee pain but had to stop shy of completing everything as she had a meeting to get to.  She was able to complete all tasks with no increase in back pain.  She would benefit from continuing skilled PT to meet goals stated below.     OBJECTIVE IMPAIRMENTS: difficulty walking, decreased strength, increased edema, increased fascial restrictions, increased muscle spasms, impaired flexibility, postural dysfunction, and pain.   ACTIVITY LIMITATIONS: bending, standing,  squatting, stairs, and transfers  PARTICIPATION LIMITATIONS: meal prep, cleaning, laundry, driving, shopping, community activity, and yard work  PERSONAL FACTORS: 1-2 comorbidities: previous menisectomy are also affecting patient's functional outcome.   REHAB POTENTIAL: Good  CLINICAL DECISION MAKING: Stable/uncomplicated  EVALUATION COMPLEXITY: Low   GOALS: Goals reviewed with patient? Yes  SHORT TERM GOALS: Target date: 07/26/2024  Patient will be independent with initial HEP  Baseline: Goal status: MET 07/08/24  2.  Pain report to be no greater than 4/10  Baseline:  Goal status: MET 07/08/24  3.  LEFS score to improve by 2 points Baseline:  Goal status: In Progress  4.  Functional scores to improve by at least 1-2 seconds Baseline:  Goal status: In Progress   LONG TERM GOALS: Target date: 08/23/2024  Patient to report pain no greater than 2/10  Baseline:  Goal status: INITIAL  2.  Patient to be independent with advanced HEP  Baseline:  Goal status: INITIAL  3.  Patient to be able to ascend and descend steps without pain or no greater than 2/10  Baseline:  Goal status: INITIAL  4.  Patient to be able to bend, stoop and squat with pain no greater than 2/10  Baseline:  Goal status: INITIAL  5.  Patient to be able to stand or walk for at least 15 min without leg pain  Baseline:  Goal status: INITIAL  6.  LEFS score to improve by 3 points or better Baseline:  Goal status: INITIAL  7.  Functional scores to improve by 2-3 seconds  Baseline:  Goal status: INITIAL   PLAN:  PT FREQUENCY: 1-2x/week  PT DURATION: 8 weeks  PLANNED INTERVENTIONS: 97110-Therapeutic exercises, 97530- Therapeutic activity,  02887- Neuromuscular re-education, [id number]- Self Care, 02859- Manual therapy, [id number]- Gait training, [phone]- Canalith repositioning, [id number]- Aquatic Therapy, [phone]- Electrical stimulation (unattended), [phone]- Electrical stimulation (manual), [id number]- Vasopneumatic  device, [id number]- Ultrasound, [id number]- Ionotophoresis 4mg /ml Dexamethasone, 20560 (1-2 muscles), 20561 (3+ muscles)- Dry Needling, Patient/Family education, Balance training, Stair training, Taping, Joint mobilization, Vestibular training, Cryotherapy, and Moist heat  PLAN FOR NEXT SESSION: Check for MRI results. Continue to assess back pain and progress quad rehab accordingly; possibly continue with lower intensity exercises    Avagrace Botelho B. Shataya Winkles, PT 07/29/24 10:59 AM [facility] [address] [place], [place] [address] Phone # [phone] Fax [phone]

## 2024-08-02 ENCOUNTER — Encounter

## 2024-08-04 ENCOUNTER — Encounter: Payer: Self-pay | Admitting: Physical Therapy

## 2024-08-04 ENCOUNTER — Ambulatory Visit: Admitting: Physical Therapy

## 2024-08-04 DIAGNOSIS — M25562 Pain in left knee: Secondary | ICD-10-CM

## 2024-08-04 DIAGNOSIS — M25662 Stiffness of left knee, not elsewhere classified: Secondary | ICD-10-CM

## 2024-08-04 DIAGNOSIS — M6281 Muscle weakness (generalized): Secondary | ICD-10-CM

## 2024-08-04 DIAGNOSIS — R293 Abnormal posture: Secondary | ICD-10-CM

## 2024-08-04 DIAGNOSIS — R252 Cramp and spasm: Secondary | ICD-10-CM

## 2024-08-04 DIAGNOSIS — R262 Difficulty in walking, not elsewhere classified: Secondary | ICD-10-CM

## 2024-08-04 NOTE — Therapy (Signed)
 OUTPATIENT PHYSICAL THERAPY LOWER EXTREMITY TREATMENT   Patient Name: Sharon Robbins MRN: [id number] DOB:09/16/1966, 58 y.o., female Today's Date: 08/04/2024  END OF SESSION:  PT End of Session - 08/04/24 1321     Visit Number 10    Date for Recertification  08/23/24    Authorization Type Aetna [place] Preferred    Authorization Time Period No auth required    PT Start Time 1233    PT Stop Time 1315    PT Time Calculation (min) 42 min    Activity Tolerance Patient tolerated treatment well    Behavior During Therapy WFL for tasks assessed/performed                 Past Medical History:  Diagnosis Date   Headache    History of kidney stones    Past Surgical History:  Procedure Laterality Date   BACK SURGERY     CESAREAN SECTION     EXTRACORPOREAL SHOCK WAVE LITHOTRIPSY Right 05/31/2018   Procedure: RIGHT EXTRACORPOREAL SHOCK WAVE LITHOTRIPSY (ESWL);  Surgeon: Matilda Senior, MD;  Location: [facility];  Service: Urology;  Laterality: Right;   Patient Active Problem List   Diagnosis Date Noted   Overweight 02/15/2014    PCP: No PCP listed  REFERRING PROVIDER: Velinda Chancy, MD  REFERRING DIAG: Left anterior knee pain/IT band  THERAPY DIAG:  Acute pain of left knee  Stiffness of left knee, not elsewhere classified  Muscle weakness (generalized)  Difficulty in walking, not elsewhere classified  Cramp and spasm  Abnormal posture  Rationale for Evaluation and Treatment: Rehabilitation  ONSET DATE: 06/16/24  SUBJECTIVE:   SUBJECTIVE STATEMENT: She met with Dr. Cristy and she got a cortisone injection in her knee on Tuesday morning. If this does not work she will try gel injections. Pain 2/10 more soreness.  From Eval: Patient had meniscus repair 03/06/23 with Dr. Chancy.  (Back fusion 2016)  She did rehab and started playing pickle ball   PERTINENT HISTORY: meniscus repair 03/06/23 with Dr. Chancy. Hx left fibular fx - non operative (2022) (Back  fusion 2016)  PAIN:  07/29/24 Are you having pain? Yes: NPRS scale: back 3/10 with 3 ibuprofen, knee 2/10 Pain location: lateral joint space Pain description: sharp/aching  Aggravating factors: pickle ball, stairs Relieving factors: ice, ibuprofen  PRECAUTIONS: None  RED FLAGS: None   WEIGHT BEARING RESTRICTIONS: No  FALLS:  Has patient fallen in last 6 months? No  LIVING ENVIRONMENT: Lives with: lives with their spouse Lives in: House/apartment Stairs: No   OCCUPATION: desk work  PLOF: Independent, Independent with basic ADLs, Independent with household mobility without device, Independent with community mobility without device, Independent with homemaking with ambulation, Independent with gait, and Independent with transfers  PATIENT GOALS: To be able to continue playing pickle ball  NEXT MD VISIT: prn  OBJECTIVE:  Note: Objective measures were completed at Evaluation unless otherwise noted.  DIAGNOSTIC FINDINGS: Patient states   PATIENT SURVEYS:  LEFS  Extreme difficulty/unable (0), Quite a bit of difficulty (1), Moderate difficulty (2), Little difficulty (3), No difficulty (4) Survey date:  06/28/24  Score total:  50/80     COGNITION: Overall cognitive status: Within functional limits for tasks assessed     SENSATION: WFL  EDEMA:  Circumferential: Left: 40 cm, right : 39 cm  MUSCLE LENGTH: Hamstrings: Right 55 deg; Left 50 deg Thomas test: Right pos; Left pos  POSTURE: No Significant postural limitations  PALPATION: Milld crepitus left knee on open chain knee  extension and flexion  LOWER EXTREMITY ROM:  WFL  LOWER EXTREMITY MMT:  Generally 4 to 4+/5 bilateral LE's  LOWER EXTREMITY SPECIAL TESTS:  Knee special tests: Held due to hx of menisectomy  FUNCTIONAL TESTS:  07/01/2024 5 times sit to stand: 9.19 sec no UE support. No knee pain Timed up and go (TUG): 6.27 sec  GAIT: Distance walked: 30 feet Assistive device utilized: None Level  of assistance: Complete Independence Comments: antalgic start up                                                                                                                                TREATMENT DATE:  08/04/24 Recumbent bike L2 x 5' PT present to discuss status Supine hooklying march with exhale and TA indraw with red loop at knees 2x10 Hooklying TA activation+ hip abduction with red loop 2 x 10 SL Clamshell with red loop + TA activation 2 x 10 bilateral  Supine bil heel dig into red physioball roll ball in and out 2x10 Hooklying alt hand and knee press into small plyo ball x 10 each Supine SLR 2x10 bil (from bolster, SAQ to SLR to support lumbar spine) Manual: Addaday to left lumbar paraspinals, left glutes, and for improved muscle elongation and decreased pain.     07/29/24 Recumbent bike L2 x 5' PT present to discuss status Supine SAQ 3lb Lt with ball squeeze 2x10 Supine SLR 2x10 bil (from bolster, SAQ to SLR to support lumbar spine) Supine hooklying march with exhale and TA indraw with red loop at knees 2x10 Supine bil heel dig into red physioball roll ball in and out 2x10 (Patient mentioned that she had a meeting at 11 so we needed to finish a little early) Manual: Addaday to left lumbar paraspinals, left glutes, and TFL/ITB for improved muscle elongation and decreased pain.  07/28/24 Recumbent bike L2 x 5' PT present to discuss status Supine SAQ 3lb Lt with ball squeeze 2x10 Supine SLR 2x10 bil (from bolster, SAQ to SLR to support lumbar spine) Supine hooklying march with exhale and TA indraw with red loop at knees 2x10 Supine bil heel dig into red physioball roll ball in and out 2x10 Supine bridge with heels on ball x 5 Glut bridge hooklying with red hip abd x10 Review of gastroc vs soleus stretch Manual: Addaday to left lumbar paraspinals, left glutes, and TFL/ITB for improved muscle elongation and decreased pain.  07/18/2024 Recumbent Bike Level 1 5 mins PT  present to discuss status Supine IT band stretch 3 x 30 Lt  Prone quad stretch with green strap 3 x 20 sec bilateral  Hooklying hip internal/ external rotation x 5 sec x 8 bilateral  Supine SAQ with 4# over blue foam roll 2 x 10 Lt  Hooklyling TA activation + hip abduction with red loop 2 x 10 Hooklying TA activation + march with red loop 2 x 10 bilateral  Supine SLR 2 x 10  bilateral  Manual: Addaday to left lumbar paraspinals, left glutes, and TFL for improved muscle elongation and decreased pain.   PATIENT EDUCATION:  Education details: Initiated HEP Person educated: Patient Education method: Programmer, Multimedia, Facilities Manager, Verbal cues, and Handouts Education comprehension: verbalized understanding, returned demonstration, and verbal cues required  HOME EXERCISE PROGRAM: Access Code: [email or web address] URL: [email or web address] Date: 07/12/2024 Prepared by: Kristeen Sar  Exercises - Supine ITB Stretch with Strap  - 1 x daily - 7 x weekly - 1 sets - 3 reps - 30 sec hold - Supine Piriformis Stretch with Foot on Ground  - 1 x daily - 7 x weekly - 1 sets - 3 reps - 30 sec hold - Sidelying IT Band Foam Roll Mobilization  - 1 x daily - 7 x weekly - 3 sets - 10 reps - Active Straight Leg Raise with Quad Set  - 1 x daily - 7 x weekly - 2 sets - 10 reps - Supine Bridge  - 1 x daily - 7 x weekly - 1-2 sets - 10 reps - Seated Long Arc Quad  - 1 x daily - 7 x weekly - 2 sets - 10 reps - Supine Hip Internal and External Rotation  - 1 x daily - 7 x weekly - 1 sets - 10 reps - 5 hold - Hooklying Clamshell with Resistance  - 1 x daily - 7 x weekly - 2 sets - 10 reps - Clamshell  - 1 x daily - 7 x weekly - 2 sets - 10 reps - Sidelying Reverse Clamshell  - 1 x daily - 7 x weekly - 2 sets - 10 reps  ASSESSMENT:  CLINICAL IMPRESSION: Beverley presents with minimal pain today, just soreness. She got a cortisone injection in her left knee and she has just had some soreness. Dr. Cristy suggested  possibly gel injection series if she does not notice a difference with cortisone shots. Treatment session focused on hip and knee strengthening with awareness of core activation. Discussed POC and plan to do a re-cert at end to continue. Patient will benefit from skilled PT to address the below impairments and improve overall function.    OBJECTIVE IMPAIRMENTS: difficulty walking, decreased strength, increased edema, increased fascial restrictions, increased muscle spasms, impaired flexibility, postural dysfunction, and pain.   ACTIVITY LIMITATIONS: bending, standing, squatting, stairs, and transfers  PARTICIPATION LIMITATIONS: meal prep, cleaning, laundry, driving, shopping, community activity, and yard work  PERSONAL FACTORS: 1-2 comorbidities: previous menisectomy are also affecting patient's functional outcome.   REHAB POTENTIAL: Good  CLINICAL DECISION MAKING: Stable/uncomplicated  EVALUATION COMPLEXITY: Low   GOALS: Goals reviewed with patient? Yes  SHORT TERM GOALS: Target date: 07/26/2024  Patient will be independent with initial HEP  Baseline: Goal status: MET 07/08/24  2.  Pain report to be no greater than 4/10  Baseline:  Goal status: MET 07/08/24  3.  LEFS score to improve by 2 points Baseline:  Goal status: In Progress  4.  Functional scores to improve by at least 1-2 seconds Baseline:  Goal status: In Progress   LONG TERM GOALS: Target date: 08/23/2024  Patient to report pain no greater than 2/10  Baseline:  Goal status: INITIAL  2.  Patient to be independent with advanced HEP  Baseline:  Goal status: INITIAL  3.  Patient to be able to ascend and descend steps without pain or no greater than 2/10  Baseline:  Goal status: INITIAL  4.  Patient to be able to bend, stoop and squat  with pain no greater than 2/10  Baseline:  Goal status: INITIAL  5.  Patient to be able to stand or walk for at least 15 min without leg pain  Baseline:  Goal status:  INITIAL  6.  LEFS score to improve by 3 points or better Baseline:  Goal status: INITIAL  7.  Functional scores to improve by 2-3 seconds  Baseline:  Goal status: INITIAL   PLAN:  PT FREQUENCY: 1-2x/week  PT DURATION: 8 weeks  PLANNED INTERVENTIONS: 97110-Therapeutic exercises, 97530- Therapeutic activity, 97112- Neuromuscular re-education, 97535- Self Care, 02859- Manual therapy, [phone]- Gait training, [phone]- Canalith repositioning, [id number]- Aquatic Therapy, [phone]- Electrical stimulation (unattended), [phone]- Electrical stimulation (manual), [id number]- Vasopneumatic device, [id number]- Ultrasound, [id number]- Ionotophoresis 4mg /ml Dexamethasone, 20560 (1-2 muscles), 20561 (3+ muscles)- Dry Needling, Patient/Family education, Balance training, Stair training, Taping, Joint mobilization, Vestibular training, Cryotherapy, and Moist heat  PLAN FOR NEXT SESSION: Continue to assess back pain and progress quad rehab accordingly; possibly continue with lower intensity exercises ; core activation with exercises   Kristeen Sar, PT, DPT 08/04/24 1:22 PM [facility] [address], [address] [place], [place] [address] Phone # [phone] Fax [phone]

## 2024-08-08 ENCOUNTER — Ambulatory Visit: Payer: Self-pay | Admitting: Physical Therapy

## 2024-08-08 ENCOUNTER — Ambulatory Visit: Payer: Self-pay | Attending: Orthopedic Surgery | Admitting: Physical Therapy

## 2024-08-08 ENCOUNTER — Encounter: Payer: Self-pay | Admitting: Physical Therapy

## 2024-08-08 DIAGNOSIS — R293 Abnormal posture: Secondary | ICD-10-CM | POA: Diagnosis present

## 2024-08-08 DIAGNOSIS — R262 Difficulty in walking, not elsewhere classified: Secondary | ICD-10-CM | POA: Diagnosis present

## 2024-08-08 DIAGNOSIS — M25662 Stiffness of left knee, not elsewhere classified: Secondary | ICD-10-CM | POA: Insufficient documentation

## 2024-08-08 DIAGNOSIS — M6281 Muscle weakness (generalized): Secondary | ICD-10-CM | POA: Insufficient documentation

## 2024-08-08 DIAGNOSIS — R252 Cramp and spasm: Secondary | ICD-10-CM | POA: Diagnosis present

## 2024-08-08 DIAGNOSIS — M25562 Pain in left knee: Secondary | ICD-10-CM | POA: Insufficient documentation

## 2024-08-08 DIAGNOSIS — M5459 Other low back pain: Secondary | ICD-10-CM | POA: Insufficient documentation

## 2024-08-08 NOTE — Therapy (Signed)
 OUTPATIENT PHYSICAL THERAPY LOWER EXTREMITY TREATMENT   Patient Name: Sharon Robbins MRN: [id number] DOB:Jul 22, 1966, 58 y.o., female Today's Date: 08/08/2024  END OF SESSION:  PT End of Session - 08/08/24 1123     Visit Number 11    Date for Recertification  08/23/24    Authorization Type Aetna [place] Preferred    Authorization Time Period No auth required    PT Start Time 0933    PT Stop Time 1015    PT Time Calculation (min) 42 min    Activity Tolerance Patient tolerated treatment well    Behavior During Therapy WFL for tasks assessed/performed                  Past Medical History:  Diagnosis Date   Headache    History of kidney stones    Past Surgical History:  Procedure Laterality Date   BACK SURGERY     CESAREAN SECTION     EXTRACORPOREAL SHOCK WAVE LITHOTRIPSY Right 05/31/2018   Procedure: RIGHT EXTRACORPOREAL SHOCK WAVE LITHOTRIPSY (ESWL);  Surgeon: Matilda Senior, MD;  Location: [facility];  Service: Urology;  Laterality: Right;   Patient Active Problem List   Diagnosis Date Noted   Overweight 02/15/2014    PCP: No PCP listed  REFERRING PROVIDER: Velinda Chancy, MD  REFERRING DIAG: Left anterior knee pain/IT band  THERAPY DIAG:  Acute pain of left knee  Stiffness of left knee, not elsewhere classified  Muscle weakness (generalized)  Difficulty in walking, not elsewhere classified  Cramp and spasm  Abnormal posture  Rationale for Evaluation and Treatment: Rehabilitation  ONSET DATE: 06/16/24  SUBJECTIVE:   SUBJECTIVE STATEMENT: Patient reports her back and left knee are feeling better. She played pickleball with her knee brace on and it went well. Back 3/10 Lt Knee 1/10.  From Eval: Patient had meniscus repair 03/06/23 with Dr. Chancy.  (Back fusion 2016)  She did rehab and started playing pickle ball   PERTINENT HISTORY: meniscus repair 03/06/23 with Dr. Chancy. Hx left fibular fx - non operative (2022) (Back fusion 2016)  PAIN:   07/29/24 Are you having pain? Yes: NPRS scale: back 3/10 with 3 ibuprofen, knee 2/10 Pain location: lateral joint space Pain description: sharp/aching  Aggravating factors: pickle ball, stairs Relieving factors: ice, ibuprofen  PRECAUTIONS: None  RED FLAGS: None   WEIGHT BEARING RESTRICTIONS: No  FALLS:  Has patient fallen in last 6 months? No  LIVING ENVIRONMENT: Lives with: lives with their spouse Lives in: House/apartment Stairs: No   OCCUPATION: desk work  PLOF: Independent, Independent with basic ADLs, Independent with household mobility without device, Independent with community mobility without device, Independent with homemaking with ambulation, Independent with gait, and Independent with transfers  PATIENT GOALS: To be able to continue playing pickle ball  NEXT MD VISIT: prn  OBJECTIVE:  Note: Objective measures were completed at Evaluation unless otherwise noted.  DIAGNOSTIC FINDINGS: Patient states   PATIENT SURVEYS:  LEFS  Extreme difficulty/unable (0), Quite a bit of difficulty (1), Moderate difficulty (2), Little difficulty (3), No difficulty (4) Survey date:  06/28/24  Score total:  50/80     COGNITION: Overall cognitive status: Within functional limits for tasks assessed     SENSATION: WFL  EDEMA:  Circumferential: Left: 40 cm, right : 39 cm  MUSCLE LENGTH: Hamstrings: Right 55 deg; Left 50 deg Thomas test: Right pos; Left pos  POSTURE: No Significant postural limitations  PALPATION: Milld crepitus left knee on open chain knee extension and flexion  LOWER EXTREMITY ROM:  WFL  LOWER EXTREMITY MMT:  Generally 4 to 4+/5 bilateral LE's  LOWER EXTREMITY SPECIAL TESTS:  Knee special tests: Held due to hx of menisectomy  FUNCTIONAL TESTS:  07/01/2024 5 times sit to stand: 9.19 sec no UE support. No knee pain Timed up and go (TUG): 6.27 sec  GAIT: Distance walked: 30 feet Assistive device utilized: None Level of assistance:  Complete Independence Comments: antalgic start up                                                                                                                                TREATMENT DATE:  08/08/24 Recumbent bike L2 x 5' PT present to discuss status Stair negotiation (alternating going up; leading with Lt coming down- when she is more painful) x 2  Ecc 2inch steps 2 x 10 Lt  Side stepping with red loop x 4  Side step & backwards step with red loop 2 x 10 bilateral  Sit to stand holding 7# DB x 10; then staggered x 10 bilateral (no weight with staggered ) Single leg stance+ opposite leg flat cone tap ( cones forwards, sideways, backwards) x 8 each bilateral  Leg Pres (seat 6) 75# 2 x 10 bilateral ; 35# unilateral x 10 bilateral    08/04/24 Recumbent bike L2 x 5' PT present to discuss status Supine hooklying march with exhale and TA indraw with red loop at knees 2x10 Hooklying TA activation+ hip abduction with red loop 2 x 10 SL Clamshell with red loop + TA activation 2 x 10 bilateral  Supine bil heel dig into red physioball roll ball in and out 2x10 Hooklying alt hand and knee press into small plyo ball x 10 each Supine SLR 2x10 bil (from bolster, SAQ to SLR to support lumbar spine) Manual: Addaday to left lumbar paraspinals, left glutes, and for improved muscle elongation and decreased pain.     07/29/24 Recumbent bike L2 x 5' PT present to discuss status Supine SAQ 3lb Lt with ball squeeze 2x10 Supine SLR 2x10 bil (from bolster, SAQ to SLR to support lumbar spine) Supine hooklying march with exhale and TA indraw with red loop at knees 2x10 Supine bil heel dig into red physioball roll ball in and out 2x10 (Patient mentioned that she had a meeting at 11 so we needed to finish a little early) Manual: Addaday to left lumbar paraspinals, left glutes, and TFL/ITB for improved muscle elongation and decreased pain.  07/28/24 Recumbent bike L2 x 5' PT present to discuss  status Supine SAQ 3lb Lt with ball squeeze 2x10 Supine SLR 2x10 bil (from bolster, SAQ to SLR to support lumbar spine) Supine hooklying march with exhale and TA indraw with red loop at knees 2x10 Supine bil heel dig into red physioball roll ball in and out 2x10 Supine bridge with heels on ball x 5 Glut bridge hooklying with red hip abd x10 Review of gastroc vs  soleus stretch Manual: Addaday to left lumbar paraspinals, left glutes, and TFL/ITB for improved muscle elongation and decreased pain.    PATIENT EDUCATION:  Education details: Initiated HEP Person educated: Patient Education method: Programmer, Multimedia, Facilities Manager, Verbal cues, and Handouts Education comprehension: verbalized understanding, returned demonstration, and verbal cues required  HOME EXERCISE PROGRAM: Access Code: [email or web address] URL: [email or web address] Date: 07/12/2024 Prepared by: Kristeen Sar  Exercises - Supine ITB Stretch with Strap  - 1 x daily - 7 x weekly - 1 sets - 3 reps - 30 sec hold - Supine Piriformis Stretch with Foot on Ground  - 1 x daily - 7 x weekly - 1 sets - 3 reps - 30 sec hold - Sidelying IT Band Foam Roll Mobilization  - 1 x daily - 7 x weekly - 3 sets - 10 reps - Active Straight Leg Raise with Quad Set  - 1 x daily - 7 x weekly - 2 sets - 10 reps - Supine Bridge  - 1 x daily - 7 x weekly - 1-2 sets - 10 reps - Seated Long Arc Quad  - 1 x daily - 7 x weekly - 2 sets - 10 reps - Supine Hip Internal and External Rotation  - 1 x daily - 7 x weekly - 1 sets - 10 reps - 5 hold - Hooklying Clamshell with Resistance  - 1 x daily - 7 x weekly - 2 sets - 10 reps - Clamshell  - 1 x daily - 7 x weekly - 2 sets - 10 reps - Sidelying Reverse Clamshell  - 1 x daily - 7 x weekly - 2 sets - 10 reps  ASSESSMENT:  CLINICAL IMPRESSION: Beverley presents with lower back and left knee pain levels today. She was able to play pickeball over the weekend with no difficulty. Since patient was less painful today  progresses strengthening exercises. With single leg balance activities she felt much weaker on her left compared to right. Overall, she tolerated treatment session well. Educated patient to start back incorporating high level HEP exercises since pain levels have subsided some. Patient will benefit from skilled PT to address the below impairments and improve overall function.    OBJECTIVE IMPAIRMENTS: difficulty walking, decreased strength, increased edema, increased fascial restrictions, increased muscle spasms, impaired flexibility, postural dysfunction, and pain.   ACTIVITY LIMITATIONS: bending, standing, squatting, stairs, and transfers  PARTICIPATION LIMITATIONS: meal prep, cleaning, laundry, driving, shopping, community activity, and yard work  PERSONAL FACTORS: 1-2 comorbidities: previous menisectomy are also affecting patient's functional outcome.   REHAB POTENTIAL: Good  CLINICAL DECISION MAKING: Stable/uncomplicated  EVALUATION COMPLEXITY: Low   GOALS: Goals reviewed with patient? Yes  SHORT TERM GOALS: Target date: 07/26/2024  Patient will be independent with initial HEP  Baseline: Goal status: MET 07/08/24  2.  Pain report to be no greater than 4/10  Baseline:  Goal status: MET 07/08/24  3.  LEFS score to improve by 2 points Baseline:  Goal status: In Progress  4.  Functional scores to improve by at least 1-2 seconds Baseline:  Goal status: In Progress   LONG TERM GOALS: Target date: 08/23/2024  Patient to report pain no greater than 2/10  Baseline:  Goal status: INITIAL  2.  Patient to be independent with advanced HEP  Baseline:  Goal status: INITIAL  3.  Patient to be able to ascend and descend steps without pain or no greater than 2/10  Baseline:  Goal status: INITIAL  4.  Patient to be able to  bend, stoop and squat with pain no greater than 2/10  Baseline:  Goal status: INITIAL  5.  Patient to be able to stand or walk for at least 15 min without  leg pain  Baseline:  Goal status: INITIAL  6.  LEFS score to improve by 3 points or better Baseline:  Goal status: INITIAL  7.  Functional scores to improve by 2-3 seconds  Baseline:  Goal status: INITIAL   PLAN:  PT FREQUENCY: 1-2x/week  PT DURATION: 8 weeks  PLANNED INTERVENTIONS: 97110-Therapeutic exercises, 97530- Therapeutic activity, 97112- Neuromuscular re-education, 97535- Self Care, 02859- Manual therapy, [phone]- Gait training, [phone]- Canalith repositioning, [id number]- Aquatic Therapy, [id number]- Electrical stimulation (unattended), [phone]- Electrical stimulation (manual), [id number]- Vasopneumatic device, [id number]- Ultrasound, [id number]- Ionotophoresis 4mg /ml Dexamethasone, 79439 (1-2 muscles), 20561 (3+ muscles)- Dry Needling, Patient/Family education, Balance training, Stair training, Taping, Joint mobilization, Vestibular training, Cryotherapy, and Moist heat  PLAN FOR NEXT SESSION: assess tolerance to treatment session; continue knee and hip strengthening as tolerated;core activation with exercises   Kristeen Sar, PT, DPT 08/08/24 11:24 AM [facility] [address] [place], [place] [address] Phone # [phone] Fax [phone]

## 2024-08-15 ENCOUNTER — Encounter: Payer: Self-pay | Admitting: Physical Therapy

## 2024-08-15 ENCOUNTER — Ambulatory Visit: Admitting: Physical Therapy

## 2024-08-15 DIAGNOSIS — M6281 Muscle weakness (generalized): Secondary | ICD-10-CM

## 2024-08-15 DIAGNOSIS — M25562 Pain in left knee: Secondary | ICD-10-CM

## 2024-08-15 DIAGNOSIS — R262 Difficulty in walking, not elsewhere classified: Secondary | ICD-10-CM

## 2024-08-15 DIAGNOSIS — M25662 Stiffness of left knee, not elsewhere classified: Secondary | ICD-10-CM

## 2024-08-15 NOTE — Therapy (Signed)
 OUTPATIENT PHYSICAL THERAPY LOWER EXTREMITY TREATMENT   Patient Name: Sharon Robbins MRN: [id number] DOB:09-04-1966, 58 y.o., female Today's Date: 08/15/2024  END OF SESSION:  PT End of Session - 08/15/24 0807     Visit Number 12    Date for Recertification  08/23/24    Authorization Type Aetna [place] Preferred    Authorization Time Period No auth required    PT Start Time 0805    PT Stop Time 0847    PT Time Calculation (min) 42 min    Activity Tolerance Patient tolerated treatment well    Behavior During Therapy [facility] for tasks assessed/performed                   Past Medical History:  Diagnosis Date   Headache    History of kidney stones    Past Surgical History:  Procedure Laterality Date   BACK SURGERY     CESAREAN SECTION     EXTRACORPOREAL SHOCK WAVE LITHOTRIPSY Right 05/31/2018   Procedure: RIGHT EXTRACORPOREAL SHOCK WAVE LITHOTRIPSY (ESWL);  Surgeon: Matilda Senior, MD;  Location: [facility];  Service: Urology;  Laterality: Right;   Patient Active Problem List   Diagnosis Date Noted   Overweight 02/15/2014    PCP: No PCP listed  REFERRING PROVIDER: Velinda Chancy, MD  REFERRING DIAG: Left anterior knee pain/IT band  THERAPY DIAG:  Acute pain of left knee  Stiffness of left knee, not elsewhere classified  Muscle weakness (generalized)  Difficulty in walking, not elsewhere classified  Rationale for Evaluation and Treatment: Rehabilitation  ONSET DATE: 06/16/24  SUBJECTIVE:   SUBJECTIVE STATEMENT: I was having a great day last time here but the improvements from the injection only lasted about 2-3 days.  My back pain also came back. MRI results were inconclusive. I played pickleball again last week 3 times with brace - it doesn't flare up the knee but rather the back (I had to ice the back).  From Eval: Patient had meniscus repair 03/06/23 with Dr. Chancy.  (Back fusion 2016)  She did rehab and started playing pickle ball   PERTINENT  HISTORY: meniscus repair 03/06/23 with Dr. Chancy. Hx left fibular fx - non operative (2022) (Back fusion 2016)  PAIN:  07/29/24 Are you having pain? Yes: NPRS scale: back 4/10 with 3 ibuprofen, knee 2/10 Pain location: lateral joint space Pain description: sharp/aching  Aggravating factors: pickle ball, stairs Relieving factors: ice, ibuprofen  PRECAUTIONS: None  RED FLAGS: None   WEIGHT BEARING RESTRICTIONS: No  FALLS:  Has patient fallen in last 6 months? No  LIVING ENVIRONMENT: Lives with: lives with their spouse Lives in: House/apartment Stairs: No   OCCUPATION: desk work  PLOF: Independent, Independent with basic ADLs, Independent with household mobility without device, Independent with community mobility without device, Independent with homemaking with ambulation, Independent with gait, and Independent with transfers  PATIENT GOALS: To be able to continue playing pickle ball  NEXT MD VISIT: prn  OBJECTIVE:  Note: Objective measures were completed at Evaluation unless otherwise noted.  DIAGNOSTIC FINDINGS: Patient states   PATIENT SURVEYS:  LEFS  Extreme difficulty/unable (0), Quite a bit of difficulty (1), Moderate difficulty (2), Little difficulty (3), No difficulty (4) Survey date:  06/28/24  Score total:  50/80     COGNITION: Overall cognitive status: Within functional limits for tasks assessed     SENSATION: WFL  EDEMA:  Circumferential: Left: 40 cm, right : 39 cm  MUSCLE LENGTH: Hamstrings: Right 55 deg; Left 50 deg  Thomas test: Right pos; Left pos  POSTURE: No Significant postural limitations  PALPATION: Milld crepitus left knee on open chain knee extension and flexion  LOWER EXTREMITY ROM:  WFL  LOWER EXTREMITY MMT:  Generally 4 to 4+/5 bilateral LE's  LOWER EXTREMITY SPECIAL TESTS:  Knee special tests: Held due to hx of menisectomy  FUNCTIONAL TESTS:  07/01/2024 5 times sit to stand: 9.19 sec no UE support. No knee pain Timed  up and go (TUG): 6.27 sec  GAIT: Distance walked: 30 feet Assistive device utilized: None Level of assistance: Complete Independence Comments: antalgic start up                                                                                                                                TREATMENT DATE:  08/15/24 Recumbent bike L3 x 5' PT present to discuss status Sit to stand holding 7# DB x 10; then staggered LEFT foot back x 8 bilateral (no weight with staggered) Supine with soft foam roller under pelvis, roll on/off Lt lateral hip, SL rolling 1-2 inches of Lt ITB at a time along entire length of soft roller SLS with single ski pole - cue to load tripod of foot and in slight knee flexion and hip hinge, 3-way taps (very hard on Lt SLS compared to Rt) Foot on wall in Lt SLS holding back of chair with bil UE, clamshell Rt knee in/out x8 -  Seated pigeon bil hips x30 each Updated HEP  08/08/24 Recumbent bike L2 x 5' PT present to discuss status Stair negotiation (alternating going up; leading with Lt coming down- when she is more painful) x 2  Ecc 2inch steps 2 x 10 Lt  Side stepping with red loop x 4  Side step & backwards step with red loop 2 x 10 bilateral  Sit to stand holding 7# DB x 10; then staggered x 10 bilateral (no weight with staggered ) Single leg stance+ opposite leg flat cone tap ( cones forwards, sideways, backwards) x 8 each bilateral  Leg Pres (seat 6) 75# 2 x 10 bilateral ; 35# unilateral x 10 bilateral    08/04/24 Recumbent bike L2 x 5' PT present to discuss status Supine hooklying march with exhale and TA indraw with red loop at knees 2x10 Hooklying TA activation+ hip abduction with red loop 2 x 10 SL Clamshell with red loop + TA activation 2 x 10 bilateral  Supine bil heel dig into red physioball roll ball in and out 2x10 Hooklying alt hand and knee press into small plyo ball x 10 each Supine SLR 2x10 bil (from bolster, SAQ to SLR to support lumbar  spine) Manual: Addaday to left lumbar paraspinals, left glutes, and for improved muscle elongation and decreased pain.  PATIENT EDUCATION:  Education details: Initiated HEP Person educated: Patient Education method: Programmer, Multimedia, Facilities Manager, Verbal cues, and Handouts Education comprehension: verbalized understanding, returned demonstration, and verbal cues required  HOME EXERCISE PROGRAM:  Access Code: [email or web address] URL: [email or web address] Date: 08/15/2024 Prepared by: Orvil Marsean Elkhatib  Exercises - Supine ITB Stretch with Strap  - 1 x daily - 7 x weekly - 1 sets - 3 reps - 30 sec hold - Supine Piriformis Stretch with Foot on Ground  - 1 x daily - 7 x weekly - 1 sets - 3 reps - 30 sec hold - Sidelying IT Band Foam Roll Mobilization  - 1 x daily - 7 x weekly - 3 sets - 10 reps - Active Straight Leg Raise with Quad Set  - 1 x daily - 7 x weekly - 2 sets - 10 reps - Supine Bridge  - 1 x daily - 7 x weekly - 1-2 sets - 10 reps - Seated Long Arc Quad  - 1 x daily - 7 x weekly - 2 sets - 10 reps - Supine Hip Internal and External Rotation  - 1 x daily - 7 x weekly - 1 sets - 10 reps - 5 hold - Hooklying Clamshell with Resistance  - 1 x daily - 7 x weekly - 2 sets - 10 reps - Clamshell  - 1 x daily - 7 x weekly - 2 sets - 10 reps - Sidelying Reverse Clamshell  - 1 x daily - 7 x weekly - 2 sets - 10 reps - Sit to Stand  - 1 x daily - 7 x weekly - 1 sets - 10 reps - Staggered Sit-to-Stand  - 1 x daily - 7 x weekly - 1 sets - 10 reps - Standing March with Unilateral Counter Support  - 1 x daily - 7 x weekly - 3 sets - 10 reps  ASSESSMENT:  CLINICAL IMPRESSION: Beverley presents with ongoing Lt knee and LBP with report of only about 2 days of relief from knee injection.  She has signif weakness about the hip and knee on Lt which is noticeable in closed chain loading.  Her balance in SLS is fair and requires UE support at this time.  MRI report was inconclusive about explaining ongoing  pain, but PT also feels that weakness and poor stability through Lt limb may be contributing and just need more time and progression of therex and neuro control in functional loading.    OBJECTIVE IMPAIRMENTS: difficulty walking, decreased strength, increased edema, increased fascial restrictions, increased muscle spasms, impaired flexibility, postural dysfunction, and pain.   ACTIVITY LIMITATIONS: bending, standing, squatting, stairs, and transfers  PARTICIPATION LIMITATIONS: meal prep, cleaning, laundry, driving, shopping, community activity, and yard work  PERSONAL FACTORS: 1-2 comorbidities: previous menisectomy are also affecting patient's functional outcome.   REHAB POTENTIAL: Good  CLINICAL DECISION MAKING: Stable/uncomplicated  EVALUATION COMPLEXITY: Low   GOALS: Goals reviewed with patient? Yes  SHORT TERM GOALS: Target date: 07/26/2024  Patient will be independent with initial HEP  Baseline: Goal status: MET 07/08/24  2.  Pain report to be no greater than 4/10  Baseline:  Goal status: MET 07/08/24  3.  LEFS score to improve by 2 points Baseline:  Goal status: In Progress  4.  Functional scores to improve by at least 1-2 seconds Baseline:  Goal status: In Progress   LONG TERM GOALS: Target date: 08/23/2024  Patient to report pain no greater than 2/10  Baseline:  Goal status: INITIAL  2.  Patient to be independent with advanced HEP  Baseline:  Goal status: INITIAL  3.  Patient to be able to ascend and descend steps without pain or no greater than 2/10  Baseline:  Goal status: INITIAL  4.  Patient to be able to bend, stoop and squat with pain no greater than 2/10  Baseline:  Goal status: INITIAL  5.  Patient to be able to stand or walk for at least 15 min without leg pain  Baseline:  Goal status: INITIAL  6.  LEFS score to improve by 3 points or better Baseline:  Goal status: INITIAL  7.  Functional scores to improve by 2-3  seconds  Baseline:  Goal status: INITIAL   PLAN:  PT FREQUENCY: 1-2x/week  PT DURATION: 8 weeks  PLANNED INTERVENTIONS: 97110-Therapeutic exercises, 97530- Therapeutic activity, 97112- Neuromuscular re-education, 97535- Self Care, 02859- Manual therapy, [phone]- Gait training, [phone]- Canalith repositioning, [id number]- Aquatic Therapy, [phone]- Electrical stimulation (unattended), [phone]- Electrical stimulation (manual), [id number]- Vasopneumatic device, [id number]- Ultrasound, [id number]- Ionotophoresis 4mg /ml Dexamethasone, [phone] (1-2 muscles), [phone] (3+ muscles)- Dry Needling, Patient/Family education, Balance training, Stair training, Taping, Joint mobilization, Vestibular training, Cryotherapy, and Moist heat  PLAN FOR NEXT SESSION: assess tolerance to treatment session; continue knee and hip strengthening as tolerated;core activation with exercises   Orvil Fester, PT 08/15/24 1:31 PM  [facility] [address], [address] [place], [place] [address] Phone # [phone] Fax [phone]

## 2024-08-18 ENCOUNTER — Ambulatory Visit: Admitting: Physical Therapy

## 2024-08-18 ENCOUNTER — Encounter: Payer: Self-pay | Admitting: Physical Therapy

## 2024-08-18 DIAGNOSIS — R252 Cramp and spasm: Secondary | ICD-10-CM

## 2024-08-18 DIAGNOSIS — R262 Difficulty in walking, not elsewhere classified: Secondary | ICD-10-CM

## 2024-08-18 DIAGNOSIS — R293 Abnormal posture: Secondary | ICD-10-CM

## 2024-08-18 DIAGNOSIS — M25562 Pain in left knee: Secondary | ICD-10-CM

## 2024-08-18 DIAGNOSIS — M6281 Muscle weakness (generalized): Secondary | ICD-10-CM

## 2024-08-18 DIAGNOSIS — M25662 Stiffness of left knee, not elsewhere classified: Secondary | ICD-10-CM

## 2024-08-18 DIAGNOSIS — M5459 Other low back pain: Secondary | ICD-10-CM

## 2024-08-18 NOTE — Therapy (Signed)
 OUTPATIENT PHYSICAL THERAPY LOWER EXTREMITY TREATMENT   Patient Name: Sharon Robbins MRN: [id number] DOB:October 09, 1965, 58 y.o., female Today's Date: 08/18/2024  END OF SESSION:  PT End of Session - 08/18/24 1621     Visit Number 13    Date for Recertification  10/13/24    Authorization Type Aetna [place] Preferred    Authorization Time Period No auth required    PT Start Time 1620    PT Stop Time 1710    PT Time Calculation (min) 50 min    Activity Tolerance Patient tolerated treatment well    Behavior During Therapy WFL for tasks assessed/performed                    Past Medical History:  Diagnosis Date   Headache    History of kidney stones    Past Surgical History:  Procedure Laterality Date   BACK SURGERY     CESAREAN SECTION     EXTRACORPOREAL SHOCK WAVE LITHOTRIPSY Right 05/31/2018   Procedure: RIGHT EXTRACORPOREAL SHOCK WAVE LITHOTRIPSY (ESWL);  Surgeon: Matilda Senior, MD;  Location: [facility];  Service: Urology;  Laterality: Right;   Patient Active Problem List   Diagnosis Date Noted   Overweight 02/15/2014    PCP: No PCP listed  REFERRING PROVIDER: Velinda Chancy, MD  REFERRING DIAG: Left anterior knee pain/IT band  THERAPY DIAG:  Acute pain of left knee  Stiffness of left knee, not elsewhere classified  Muscle weakness (generalized)  Difficulty in walking, not elsewhere classified  Other low back pain  Abnormal posture  Cramp and spasm  Rationale for Evaluation and Treatment: Rehabilitation  ONSET DATE: 06/16/24  SUBJECTIVE:   SUBJECTIVE STATEMENT: My doctor added my LBP to my referral for PT.  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  Currently in a flare up.  Did a prednisone pack about 1 mo ago which didn't help. This flare up is a 4-5/10 pain with muscle tightness.    From Eval: Patient had meniscus repair 03/06/23 with Dr. Chancy.  (Back fusion 2016)  She did rehab and started  playing pickle ball   PERTINENT HISTORY: meniscus repair 03/06/23 with Dr. Chancy. Hx left fibular fx - non operative (2022) (Back fusion 2016)  PAIN:  07/29/24 Are you having pain? Yes: NPRS scale: back 4/10 with 3 ibuprofen, knee 2/10 Pain location: lateral joint space Pain description: sharp/aching  Aggravating factors: pickle ball, stairs Relieving factors: ice, ibuprofen  PAIN:  Are you having pain? Yes NPRS scale: 4-5/10 Pain location: central LBP which spreads Rt and Lt Pain orientation: Right, Left, and Lower  PAIN TYPE: dull Pain description: intermittent  Aggravating factors: first thing in AM, playing pickleball, sitting Relieving factors: stand and move   PRECAUTIONS: None  RED FLAGS: None   WEIGHT BEARING RESTRICTIONS: No  FALLS:  Has patient fallen in last 6 months? No  LIVING ENVIRONMENT: Lives with: lives with their spouse Lives in: House/apartment Stairs: No   OCCUPATION: desk work  PLOF: Independent, Independent with basic ADLs, Independent with household mobility without device, Independent with community mobility without device, Independent with homemaking with ambulation, Independent with gait, and Independent with transfers  PATIENT GOALS: To be able to continue playing pickle ball  NEXT MD VISIT: prn  OBJECTIVE:  Note: Objective measures were completed at Evaluation unless otherwise noted.  DIAGNOSTIC FINDINGS: Patient states   PATIENT SURVEYS:  Modified ODI 08/18/24: 16/50  LEFS  Extreme difficulty/unable (0),  Quite a bit of difficulty (1), Moderate difficulty (2), Little difficulty (3), No difficulty (4) Survey date:  06/28/24  Score total:  50/80     COGNITION: Overall cognitive status: Within functional limits for tasks assessed     SENSATION: WFL  EDEMA:  Circumferential: Left: 40 cm, right : 39 cm  MUSCLE LENGTH: 11/13: WNL bil hamstring length  Hamstrings: 50 bil  Thomas test: Right pos; Left pos  POSTURE: No  Significant postural limitations  PALPATION: Milld crepitus left knee on open chain knee extension and flexion  08/18/24: ropey tightness Lt>Rt lumbar and thoracic paraspinals, limited thoracic PA upper and middle thoracic spine, tender Lt gluteals and piriformis  TRUNK ROM: 11/13: full, without pain  LOWER EXTREMITY ROM:  WFL  LOWER EXTREMITY MMT: Left hip abd, ER and extension 4/5   LOWER EXTREMITY SPECIAL TESTS:  Knee special tests: Held due to hx of menisectomy  FUNCTIONAL TESTS:  07/01/2024 5 times sit to stand: 9.19 sec no UE support. No knee pain Timed up and go (TUG): 6.27 sec  GAIT: Distance walked: 30 feet Assistive device utilized: None Level of assistance: Complete Independence Comments: antalgic start up                                                                                                                                TREATMENT DATE:  08/18/24 ERO with addition of lumbar spine diagnosis Spine ROM several reps each: cat/cow, child's pose with SB, open book with slow costal expansion inhale/exhale at end range, lower trunk rotation Standing bil yellow tband pullbacks and forwards in upright position, square stance 5x5 holds for deep core activation Standing pallof press holding both handles of yellow band x5 each side Chair sit ups hold 3lb 2x10 Chair russian twist 3lb 2x20 Updated HEP Discussed POC for incorporating movement and deep core stabilization  08/15/24 Recumbent bike L3 x 5' PT present to discuss status Sit to stand holding 7# DB x 10; then staggered LEFT foot back x 8 bilateral (no weight with staggered) Supine with soft foam roller under pelvis, roll on/off Lt lateral hip, SL rolling 1-2 inches of Lt ITB at a time along entire length of soft roller SLS with single ski pole - cue to load tripod of foot and in slight knee flexion and hip hinge, 3-way taps (very hard on Lt SLS compared to Rt) Foot on wall in Lt SLS holding back of chair  with bil UE, clamshell Rt knee in/out x8 -  Seated pigeon bil hips x30 each Updated HEP  08/08/24 Recumbent bike L2 x 5' PT present to discuss status Stair negotiation (alternating going up; leading with Lt coming down- when she is more painful) x 2  Ecc 2inch steps 2 x 10 Lt  Side stepping with red loop x 4  Side step & backwards step with red loop 2 x 10 bilateral  Sit to stand holding 7# DB x 10; then  staggered x 10 bilateral (no weight with staggered ) Single leg stance+ opposite leg flat cone tap ( cones forwards, sideways, backwards) x 8 each bilateral  Leg Pres (seat 6) 75# 2 x 10 bilateral ; 35# unilateral x 10 bilateral   PATIENT EDUCATION:  Education details: Initiated HEP Person educated: Patient Education method: Programmer, Multimedia, Facilities Manager, Verbal cues, and Handouts Education comprehension: verbalized understanding, returned demonstration, and verbal cues required  HOME EXERCISE PROGRAM: Access Code: [email or web address] URL: [email or web address] Date: 08/18/2024 Prepared by: Orvil Donnis Phaneuf  Exercises - Supine ITB Stretch with Strap  - 1 x daily - 7 x weekly - 1 sets - 3 reps - 30 sec hold - Supine Piriformis Stretch with Foot on Ground  - 1 x daily - 7 x weekly - 1 sets - 3 reps - 30 sec hold - Sidelying IT Band Foam Roll Mobilization  - 1 x daily - 7 x weekly - 3 sets - 10 reps - Active Straight Leg Raise with Quad Set  - 1 x daily - 7 x weekly - 2 sets - 10 reps - Supine Bridge  - 1 x daily - 7 x weekly - 1-2 sets - 10 reps - Seated Long Arc Quad  - 1 x daily - 7 x weekly - 2 sets - 10 reps - Supine Hip Internal and External Rotation  - 1 x daily - 7 x weekly - 1 sets - 10 reps - 5 hold - Hooklying Clamshell with Resistance  - 1 x daily - 7 x weekly - 2 sets - 10 reps - Clamshell  - 1 x daily - 7 x weekly - 2 sets - 10 reps - Sidelying Reverse Clamshell  - 1 x daily - 7 x weekly - 2 sets - 10 reps - Sit to Stand  - 1 x daily - 7 x weekly - 1 sets - 10 reps -  Staggered Sit-to-Stand  - 1 x daily - 7 x weekly - 1 sets - 10 reps - Standing March with Unilateral Counter Support  - 1 x daily - 7 x weekly - 3 sets - 10 reps - Modified Eccentric Sit Up in Backless Chair  - 1 x daily - 7 x weekly - 2 sets - 10 reps - Cat Cow  - 1 x daily - 7 x weekly - 1 sets - 10 reps - 5 hold - Child's Pose with Sidebending  - 1 x daily - 7 x weekly - 1 sets - 3 reps - 10 hold - Sidelying Thoracic Rotation with Open Book  - 1 x daily - 7 x weekly - 1 sets - 5 reps - 1-2 breath cycles hold - Supine Lower Trunk Rotation  - 1 x daily - 7 x weekly - 1 sets - 3 reps - 10 hold  ASSESSMENT:  CLINICAL IMPRESSION: Beverley continues to display weakness and challenged stability in SLS in Lt LE.  Today we assessed lumbar spine for additional diagnosis from MD to treat LBP.  She has intermittent flare ups that vary in intensity since her lumbar fusion of L4/5 in 2016.  Current pain is 4-5/10, worse in AM and with prolonged sitting >15 min and static standing >15 min.  Pain is improved with movement and staying active.  She presents with full trunk ROM without pain.  She has significant tone of Lt>Rt lumbar and thoracic paraspinals which is likely a substitution for weakness in deep core.  She will benefit from instruction of a HEP targeting  spine ROM and core/hip strength.  We will plan to continue working on Lt LE balance and strength/stability for the post-op Lt knee.    OBJECTIVE IMPAIRMENTS: difficulty walking, decreased strength, increased edema, increased fascial restrictions, increased muscle spasms, impaired flexibility, postural dysfunction, and pain.   ACTIVITY LIMITATIONS: bending, standing, squatting, stairs, and transfers  PARTICIPATION LIMITATIONS: meal prep, cleaning, laundry, driving, shopping, community activity, and yard work  PERSONAL FACTORS: 1-2 comorbidities: previous menisectomy are also affecting patient's functional outcome.   REHAB POTENTIAL: Good  CLINICAL  DECISION MAKING: Stable/uncomplicated  EVALUATION COMPLEXITY: Low   GOALS: Goals reviewed with patient? Yes  SHORT TERM GOALS: Target date: 07/26/2024  Patient will be independent with initial HEP  Baseline: Goal status: MET 07/08/24  2.  Pain report to be no greater than 4/10  Baseline:  Goal status: MET 07/08/24  3.  LEFS score to improve by 2 points Baseline:  Goal status: In Progress  4.  Functional scores to improve by at least 1-2 seconds Baseline:  Goal status: In Progress   LONG TERM GOALS: Target date: 08/23/2024  Patient to report pain no greater than 2/10 for both back and lumbar spine Baseline:  Goal status: ongoing 11/13  2.  Patient to be independent with advanced HEP  Baseline:  Goal status: ongoing - progressing challenges for LE and adding lumbar 11/13  3.  Patient to be able to ascend and descend steps without pain or no greater than 2/10  Baseline:  Goal status: ongoing for descending 11/13  4.  Patient to be able to bend, stoop and squat with pain no greater than 2/10  Baseline:  Goal status: ongoing 11/13  5.  Patient to be able to stand or walk for at least 15 min without leg pain  Baseline:  Goal status: MET 11/13  6.  LEFS and Modified ODI score to improve by 3 points or better Baseline:  Goal status: INITIAL  7.  Functional scores to improve by 2-3 seconds  Baseline:  Goal status: INITIAL  8. Pt will report LBP not to exceed 3/10 during work day with use of HEP and frequent change of positions (using sit stand desk).  Goal status: NEW 11/13   PLAN:  PT FREQUENCY: 1-2x/week  PT DURATION: 8 weeks  PLANNED INTERVENTIONS: 97110-Therapeutic exercises, 97530- Therapeutic activity, 97112- Neuromuscular re-education, 97535- Self Care, 02859- Manual therapy, [phone]- Gait training, [phone]- Canalith repositioning, [phone]- Aquatic Therapy, [phone]- Electrical stimulation (unattended), [phone]- Electrical stimulation (manual), [id number]- Vasopneumatic  device, [id number]- Ultrasound, [id number]- Ionotophoresis 4mg /ml Dexamethasone, 79439 (1-2 muscles), [phone] (3+ muscles)- Dry Needling, Patient/Family education, Balance training, Stair training, Taping, Joint mobilization, Vestibular training, Cryotherapy, and Moist heat  PLAN FOR NEXT SESSION: update STG and LTG for functional tests and LEFS (did not have time last session), give Pt yellow tband and add bil shoulder flexion and ext standing and pallof press (went over last session but didn't add to HEP yet), core strength, continue Lt knee and hip strengthening/stability/balance in closed chain as tolerated.  PT IS INTERESTED IN DN for lumbar spine as needed   Orvil Fester, PT 08/18/24 5:26 PM   [facility] [address], [address] [place], [place] [address] Phone # [phone] Fax [phone]

## 2024-08-22 ENCOUNTER — Ambulatory Visit: Payer: Self-pay | Admitting: Physical Therapy

## 2024-08-25 ENCOUNTER — Ambulatory Visit: Payer: Self-pay

## 2024-08-25 DIAGNOSIS — R293 Abnormal posture: Secondary | ICD-10-CM

## 2024-08-25 DIAGNOSIS — M25662 Stiffness of left knee, not elsewhere classified: Secondary | ICD-10-CM

## 2024-08-25 DIAGNOSIS — M25562 Pain in left knee: Secondary | ICD-10-CM

## 2024-08-25 DIAGNOSIS — R262 Difficulty in walking, not elsewhere classified: Secondary | ICD-10-CM

## 2024-08-25 DIAGNOSIS — M6281 Muscle weakness (generalized): Secondary | ICD-10-CM

## 2024-08-25 DIAGNOSIS — M5459 Other low back pain: Secondary | ICD-10-CM

## 2024-08-25 DIAGNOSIS — R252 Cramp and spasm: Secondary | ICD-10-CM

## 2024-08-25 NOTE — Therapy (Signed)
 OUTPATIENT PHYSICAL THERAPY LOWER EXTREMITY TREATMENT   Patient Name: Sharon Robbins MRN: [id number] DOB:08-26-1966, 58 y.o., female Today's Date: 08/25/2024  END OF SESSION:  PT End of Session - 08/25/24 1618     Visit Number 14    Date for Recertification  10/13/24    Authorization Type [employer] [place] [employer]    Authorization Time Period No auth required    PT Start Time 1618    PT Stop Time 1652    PT Time Calculation (min) 34 min    Activity Tolerance Patient tolerated treatment well    Behavior During Therapy WFL for tasks assessed/performed          Past Medical History:  Diagnosis Date   Headache    History of kidney stones    Past Surgical History:  Procedure Laterality Date   BACK SURGERY     CESAREAN SECTION     EXTRACORPOREAL SHOCK WAVE LITHOTRIPSY Right 05/31/2018   Procedure: RIGHT EXTRACORPOREAL SHOCK WAVE LITHOTRIPSY (ESWL);  Surgeon: Matilda Senior, MD;  Location: [facility];  Service: Urology;  Laterality: Right;   Patient Active Problem List   Diagnosis Date Noted   Overweight 02/15/2014    PCP: No PCP listed  REFERRING PROVIDER: Velinda Chancy, MD  REFERRING DIAG: Left anterior knee pain/IT band  THERAPY DIAG:  Acute pain of left knee  Difficulty in walking, not elsewhere classified  Other low back pain  Stiffness of left knee, not elsewhere classified  Muscle weakness (generalized)  Cramp and spasm  Abnormal posture  Rationale for Evaluation and Treatment: Rehabilitation  ONSET DATE: 06/16/24  SUBJECTIVE:   SUBJECTIVE STATEMENT: Patient reports she is doing ok.  I think I will get the gel injections.  I have a note to myself to call Dr. Cristy and schedule this.      From Eval: Patient had meniscus repair 03/06/23 with Dr. Chancy.  (Back fusion 2016)  She did rehab and started playing pickle ball 8 months later.     PERTINENT HISTORY: meniscus repair 03/06/23 with Dr. Chancy. Hx left fibular fx - non operative (2022) (Back  fusion 2016)  PAIN:  07/29/24 Are you having pain? Yes: NPRS scale: back 4/10 with 3 ibuprofen, knee 2/10 Pain location: lateral joint space Pain description: sharp/aching  Aggravating factors: pickle ball, stairs Relieving factors: ice, ibuprofen  PAIN:  Are you having pain? Yes NPRS scale: 4-5/10 Pain location: central LBP which spreads Rt and Lt Pain orientation: Right, Left, and Lower  PAIN TYPE: dull Pain description: intermittent  Aggravating factors: first thing in AM, playing pickleball, sitting Relieving factors: stand and move   PRECAUTIONS: None  RED FLAGS: None   WEIGHT BEARING RESTRICTIONS: No  FALLS:  Has patient fallen in last 6 months? No  LIVING ENVIRONMENT: Lives with: lives with their spouse Lives in: House/apartment Stairs: No   OCCUPATION: desk work  PLOF: Independent, Independent with basic ADLs, Independent with household mobility without device, Independent with community mobility without device, Independent with homemaking with ambulation, Independent with gait, and Independent with transfers  PATIENT GOALS: To be able to continue playing pickle ball  NEXT MD VISIT: prn  OBJECTIVE:  Note: Objective measures were completed at Evaluation unless otherwise noted.  DIAGNOSTIC FINDINGS: Patient states   PATIENT SURVEYS:  Modified ODI 08/18/24: 16/50  LEFS  Extreme difficulty/unable (0), Quite a bit of difficulty (1), Moderate difficulty (2), Little difficulty (3), No difficulty (4) Survey date:  06/28/24  Score total:  50/80  COGNITION: Overall cognitive status: Within functional limits for tasks assessed     SENSATION: WFL  EDEMA:  Circumferential: Left: 40 cm, right : 39 cm  MUSCLE LENGTH: 11/13: WNL bil hamstring length  Hamstrings: 50 bil  Thomas test: Right pos; Left pos  POSTURE: No Significant postural limitations  PALPATION: Milld crepitus left knee on open chain knee extension and flexion  08/18/24: ropey  tightness Lt>Rt lumbar and thoracic paraspinals, limited thoracic PA upper and middle thoracic spine, tender Lt gluteals and piriformis  TRUNK ROM: 11/13: full, without pain  LOWER EXTREMITY ROM:  WFL  LOWER EXTREMITY MMT: Left hip abd, ER and extension 4/5   LOWER EXTREMITY SPECIAL TESTS:  Knee special tests: Held due to hx of menisectomy  FUNCTIONAL TESTS:  07/01/2024 5 times sit to stand: 9.19 sec no UE support. No knee pain Timed up and go (TUG): 6.27 sec    08/25/24  Patient had to leave 10 min early (did not have time) GAIT: Distance walked: 30 feet Assistive device utilized: None Level of assistance: Complete Independence Comments: antalgic start up                                                                                                                                TREATMENT DATE:  08/25/24 Nustep x 5 min level 5 Standing hamstring stretch 2 x 30 sec Standing quad/hip flexor stretch 2 x 30 Seated piriformis stretch 2 x 30 sec Supine PPT x 20 PPT with 90/90 heel tap PPT with dying bug Patient states she has to leave 10 min prior to end of session  08/18/24 ERO with addition of lumbar spine diagnosis Spine ROM several reps each: cat/cow, child's pose with SB, open book with slow costal expansion inhale/exhale at end range, lower trunk rotation Standing bil yellow tband pullbacks and forwards in upright position, square stance 5x5 holds for deep core activation Standing pallof press holding both handles of yellow band x5 each side Chair sit ups hold 3lb 2x10 Chair russian twist 3lb 2x20 Updated HEP Discussed POC for incorporating movement and deep core stabilization  08/15/24 Recumbent bike L3 x 5' PT present to discuss status Sit to stand holding 7# DB x 10; then staggered LEFT foot back x 8 bilateral (no weight with staggered) Supine with soft foam roller under pelvis, roll on/off Lt lateral hip, SL rolling 1-2 inches of Lt ITB at a time along  entire length of soft roller SLS with single ski pole - cue to load tripod of foot and in slight knee flexion and hip hinge, 3-way taps (very hard on Lt SLS compared to Rt) Foot on wall in Lt SLS holding back of chair with bil UE, clamshell Rt knee in/out x8 -  Seated pigeon bil hips x30 each Updated HEP  08/08/24 Recumbent bike L2 x 5' PT present to discuss status Stair negotiation (alternating going up; leading with Lt coming down- when she is more  painful) x 2  Ecc 2inch steps 2 x 10 Lt  Side stepping with red loop x 4  Side step & backwards step with red loop 2 x 10 bilateral  Sit to stand holding 7# DB x 10; then staggered x 10 bilateral (no weight with staggered ) Single leg stance+ opposite leg flat cone tap ( cones forwards, sideways, backwards) x 8 each bilateral  Leg Pres (seat 6) 75# 2 x 10 bilateral ; 35# unilateral x 10 bilateral   PATIENT EDUCATION:  Education details: Initiated HEP Person educated: Patient Education method: Programmer, Multimedia, Facilities Manager, Verbal cues, and Handouts Education comprehension: verbalized understanding, returned demonstration, and verbal cues required  HOME EXERCISE PROGRAM: Access Code: [email or web address] URL: [email or web address] Date: 08/18/2024 Prepared by: Orvil Beuhring  Exercises - Supine ITB Stretch with Strap  - 1 x daily - 7 x weekly - 1 sets - 3 reps - 30 sec hold - Supine Piriformis Stretch with Foot on Ground  - 1 x daily - 7 x weekly - 1 sets - 3 reps - 30 sec hold - Sidelying IT Band Foam Roll Mobilization  - 1 x daily - 7 x weekly - 3 sets - 10 reps - Active Straight Leg Raise with Quad Set  - 1 x daily - 7 x weekly - 2 sets - 10 reps - Supine Bridge  - 1 x daily - 7 x weekly - 1-2 sets - 10 reps - Seated Long Arc Quad  - 1 x daily - 7 x weekly - 2 sets - 10 reps - Supine Hip Internal and External Rotation  - 1 x daily - 7 x weekly - 1 sets - 10 reps - 5 hold - Hooklying Clamshell with Resistance  - 1 x daily - 7 x  weekly - 2 sets - 10 reps - Clamshell  - 1 x daily - 7 x weekly - 2 sets - 10 reps - Sidelying Reverse Clamshell  - 1 x daily - 7 x weekly - 2 sets - 10 reps - Sit to Stand  - 1 x daily - 7 x weekly - 1 sets - 10 reps - Staggered Sit-to-Stand  - 1 x daily - 7 x weekly - 1 sets - 10 reps - Standing March with Unilateral Counter Support  - 1 x daily - 7 x weekly - 3 sets - 10 reps - Modified Eccentric Sit Up in Backless Chair  - 1 x daily - 7 x weekly - 2 sets - 10 reps - Cat Cow  - 1 x daily - 7 x weekly - 1 sets - 10 reps - 5 hold - Child's Pose with Sidebending  - 1 x daily - 7 x weekly - 1 sets - 3 reps - 10 hold - Sidelying Thoracic Rotation with Open Book  - 1 x daily - 7 x weekly - 1 sets - 5 reps - 1-2 breath cycles hold - Supine Lower Trunk Rotation  - 1 x daily - 7 x weekly - 1 sets - 3 reps - 10 hold  ASSESSMENT:  CLINICAL IMPRESSION: Sharon Robbins demonstrates improved stability when she demonstrates her single leg excursion exercise.  She continues to experience low back and knee pain but she is also continuing to play pickle ball.  She struggled to maintain pelvic tilt during core exercises today.  We had to switch to supported version of heel taps.  She had some low back pain during core exercises due to her inability to maintain neutral pelvis.  She is well motivated but may need to hold on competing to allow her knee and low back to heal.  She would benefit from continuing skilled PT for LE stretching, core stabilization and knee stability/quad and hip training.      OBJECTIVE IMPAIRMENTS: difficulty walking, decreased strength, increased edema, increased fascial restrictions, increased muscle spasms, impaired flexibility, postural dysfunction, and pain.   ACTIVITY LIMITATIONS: bending, standing, squatting, stairs, and transfers  PARTICIPATION LIMITATIONS: meal prep, cleaning, laundry, driving, shopping, community activity, and yard work  PERSONAL FACTORS: 1-2 comorbidities: previous  menisectomy are also affecting patient's functional outcome.   REHAB POTENTIAL: Good  CLINICAL DECISION MAKING: Stable/uncomplicated  EVALUATION COMPLEXITY: Low   GOALS: Goals reviewed with patient? Yes  SHORT TERM GOALS: Target date: 07/26/2024  Patient will be independent with initial HEP  Baseline: Goal status: MET 07/08/24  2.  Pain report to be no greater than 4/10  Baseline:  Goal status: MET 07/08/24  3.  LEFS score to improve by 2 points Baseline:  Goal status: In Progress  4.  Functional scores to improve by at least 1-2 seconds Baseline:  Goal status: In Progress   LONG TERM GOALS: Target date: 08/23/2024  Patient to report pain no greater than 2/10 for both back and lumbar spine Baseline:  Goal status: ongoing 11/13  2.  Patient to be independent with advanced HEP  Baseline:  Goal status: ongoing - progressing challenges for LE and adding lumbar 11/13  3.  Patient to be able to ascend and descend steps without pain or no greater than 2/10  Baseline:  Goal status: ongoing for descending 11/13  4.  Patient to be able to bend, stoop and squat with pain no greater than 2/10  Baseline:  Goal status: ongoing 11/13  5.  Patient to be able to stand or walk for at least 15 min without leg pain  Baseline:  Goal status: MET 11/13  6.  LEFS and Modified ODI score to improve by 3 points or better Baseline:  Goal status: INITIAL  7.  Functional scores to improve by 2-3 seconds  Baseline:  Goal status: INITIAL  8. Pt will report LBP not to exceed 3/10 during work day with use of HEP and frequent change of positions (using sit stand desk).  Goal status: NEW 11/13   PLAN:  PT FREQUENCY: 1-2x/week  PT DURATION: 8 weeks  PLANNED INTERVENTIONS: 97110-Therapeutic exercises, 97530- Therapeutic activity, 97112- Neuromuscular re-education, 97535- Self Care, [id number]- Manual therapy, [phone]- Gait training, [phone]- Canalith repositioning, [id number]- Aquatic Therapy,  [phone]- Electrical stimulation (unattended), [phone]- Electrical stimulation (manual), [id number]- Vasopneumatic device, [id number]- Ultrasound, [id number]- Ionotophoresis 4mg /ml Dexamethasone, [id number] (1-2 muscles), [id number] (3+ muscles)- Dry Needling, Patient/Family education, Balance training, Stair training, Taping, Joint mobilization, Vestibular training, Cryotherapy, and Moist heat  PLAN FOR NEXT SESSION: Patient had to leave early this appointment so we did not get to update STG and LTG for functional tests and LEFS,  core strength, continue Lt knee and hip strengthening/stability/balance in closed chain as tolerated.  PT IS INTERESTED IN DN for lumbar spine as needed  Angello Chien B. Kody Brandl, PT 08/25/24 5:02 PM [facility] [address], [address] [place], [place] [address] Phone # [phone] Fax [phone]

## 2024-08-29 ENCOUNTER — Encounter: Payer: Self-pay | Admitting: Physical Therapy

## 2024-08-29 ENCOUNTER — Ambulatory Visit: Admitting: Physical Therapy

## 2024-08-29 DIAGNOSIS — M6281 Muscle weakness (generalized): Secondary | ICD-10-CM

## 2024-08-29 DIAGNOSIS — M25662 Stiffness of left knee, not elsewhere classified: Secondary | ICD-10-CM

## 2024-08-29 DIAGNOSIS — M25562 Pain in left knee: Secondary | ICD-10-CM | POA: Diagnosis not present

## 2024-08-29 DIAGNOSIS — R262 Difficulty in walking, not elsewhere classified: Secondary | ICD-10-CM

## 2024-08-29 DIAGNOSIS — M5459 Other low back pain: Secondary | ICD-10-CM

## 2024-08-29 NOTE — Therapy (Signed)
 OUTPATIENT PHYSICAL THERAPY LOWER EXTREMITY TREATMENT   Patient Name: Sharon Robbins MRN: [id number] DOB:[birth year], 58 y.o., female Today's Date: 08/29/2024  END OF SESSION:  PT End of Session - 08/29/24 1621     Visit Number 15    Date for Recertification  10/13/24    Authorization Type [employer] [place] [employer]    Authorization Time Period No auth required    PT Start Time 1620    PT Stop Time 1700    PT Time Calculation (min) 40 min    Activity Tolerance Patient tolerated treatment well    Behavior During Therapy WFL for tasks assessed/performed           Past Medical History:  Diagnosis Date   Headache    History of kidney stones    Past Surgical History:  Procedure Laterality Date   BACK SURGERY     CESAREAN SECTION     EXTRACORPOREAL SHOCK WAVE LITHOTRIPSY Right 05/31/2018   Procedure: RIGHT EXTRACORPOREAL SHOCK WAVE LITHOTRIPSY (ESWL);  Surgeon: Matilda Senior, MD;  Location: [facility];  Service: Urology;  Laterality: Right;   Patient Active Problem List   Diagnosis Date Noted   Overweight 02/15/2014    PCP: No PCP listed  REFERRING PROVIDER: Velinda Chancy, MD  REFERRING DIAG: Left anterior knee pain/IT band  THERAPY DIAG:  Acute pain of left knee  Difficulty in walking, not elsewhere classified  Other low back pain  Stiffness of left knee, not elsewhere classified  Muscle weakness (generalized)  Rationale for Evaluation and Treatment: Rehabilitation  ONSET DATE: 06/16/24  SUBJECTIVE:   SUBJECTIVE STATEMENT: My knee is all puffed up and painful.  I'm limping.  I played in a tournament on Sat and it felt ok while playing but Sunday the increased pain and swelling started. It's the whole knee.    From Eval: Patient had meniscus repair 03/06/23 with Dr. Chancy.  (Back fusion 2016)  She did rehab and started playing pickle ball 8 months later.     PERTINENT HISTORY: meniscus repair 03/06/23 with Dr. Chancy. Hx left fibular fx - non operative  (2022) (Back fusion 2016)  PAIN:  07/29/24 Are you having pain? Yes: NPRS scale: back 4/10 with 3 ibuprofen, knee 2/10 Pain location: lateral joint space Pain description: sharp/aching  Aggravating factors: pickle ball, stairs Relieving factors: ice, ibuprofen  PAIN:  Are you having pain? Yes NPRS scale: 5-6/10 Pain location: central LBP which spreads Rt and Lt Pain orientation: Right, Left, and Lower  PAIN TYPE: dull Pain description: intermittent  Aggravating factors: first thing in AM, playing pickleball, sitting Relieving factors: stand and move   PRECAUTIONS: None  RED FLAGS: None   WEIGHT BEARING RESTRICTIONS: No  FALLS:  Has patient fallen in last 6 months? No  LIVING ENVIRONMENT: Lives with: lives with their spouse Lives in: House/apartment Stairs: No   OCCUPATION: desk work  PLOF: Independent, Independent with basic ADLs, Independent with household mobility without device, Independent with community mobility without device, Independent with homemaking with ambulation, Independent with gait, and Independent with transfers  PATIENT GOALS: To be able to continue playing pickle ball  NEXT MD VISIT: prn  OBJECTIVE:  Note: Objective measures were completed at Evaluation unless otherwise noted.  DIAGNOSTIC FINDINGS: Patient states   PATIENT SURVEYS:  Modified ODI 08/18/24: 16/50  LEFS  Extreme difficulty/unable (0), Quite a bit of difficulty (1), Moderate difficulty (2), Little difficulty (3), No difficulty (4) Survey date:  06/28/24  Score total:  50/80  COGNITION: Overall cognitive status: Within functional limits for tasks assessed     SENSATION: WFL  EDEMA:  Circumferential: Left: 40 cm, right : 39 cm 08/29/24: circumferential mid-patella both sides 30cm.  Observable edema pocketing medial and lateral lower knee quadrants below patella  MUSCLE LENGTH: 11/13: WNL bil hamstring length  Hamstrings: 50 bil  Thomas test: Right pos; Left  pos  POSTURE: No Significant postural limitations  PALPATION: Milld crepitus left knee on open chain knee extension and flexion  08/18/24: ropey tightness Lt>Rt lumbar and thoracic paraspinals, limited thoracic PA upper and middle thoracic spine, tender Lt gluteals and piriformis  TRUNK ROM: 11/13: full, without pain 11/24: Lt knee flexion 115, Lt knee ext -4.  Rt knee flexion 148, Rt knee ext full  LOWER EXTREMITY ROM:  WFL  LOWER EXTREMITY MMT: Left hip abd, ER and extension 4/5   LOWER EXTREMITY SPECIAL TESTS:  Knee special tests: Held due to hx of menisectomy  FUNCTIONAL TESTS:  07/01/2024 5 times sit to stand: 9.19 sec no UE support. No knee pain Timed up and go (TUG): 6.27 sec    08/25/24  Patient had to leave 10 min early (did not have time) GAIT: Distance walked: 30 feet Assistive device utilized: None Level of assistance: Complete Independence Comments: antalgic start up                                                                                                                                TREATMENT DATE:  08/29/24 Discussion about flare up ROM, edema, observation of Lt knee Retrograde massage Lt knee in supine with Lt LE elevated Patellar mobs Gr III medially Heel slides Lt Game ready medium compression 3 flakes Lt knee x 10' Pt educ: continue quad sets for knee ext, heel slides for flexion ROM within painfree range, continue ice/elevation  08/25/24 Nustep x 5 min level 5 Standing hamstring stretch 2 x 30 sec Standing quad/hip flexor stretch 2 x 30 Seated piriformis stretch 2 x 30 sec Supine PPT x 20 PPT with 90/90 heel tap PPT with dying bug Patient states she has to leave 10 min prior to end of session  08/18/24 ERO with addition of lumbar spine diagnosis Spine ROM several reps each: cat/cow, child's pose with SB, open book with slow costal expansion inhale/exhale at end range, lower trunk rotation Standing bil yellow tband pullbacks and  forwards in upright position, square stance 5x5 holds for deep core activation Standing pallof press holding both handles of yellow band x5 each side Chair sit ups hold 3lb 2x10 Chair russian twist 3lb 2x20 Updated HEP Discussed POC for incorporating movement and deep core stabilization  08/15/24 Recumbent bike L3 x 5' PT present to discuss status Sit to stand holding 7# DB x 10; then staggered LEFT foot back x 8 bilateral (no weight with staggered) Supine with soft foam roller under pelvis, roll on/off Lt lateral hip, SL rolling 1-2 inches of Lt  ITB at a time along entire length of soft roller SLS with single ski pole - cue to load tripod of foot and in slight knee flexion and hip hinge, 3-way taps (very hard on Lt SLS compared to Rt) Foot on wall in Lt SLS holding back of chair with bil UE, clamshell Rt knee in/out x8 -  Seated pigeon bil hips x30 each Updated HEP  08/08/24 Recumbent bike L2 x 5' PT present to discuss status Stair negotiation (alternating going up; leading with Lt coming down- when she is more painful) x 2  Ecc 2inch steps 2 x 10 Lt  Side stepping with red loop x 4  Side step & backwards step with red loop 2 x 10 bilateral  Sit to stand holding 7# DB x 10; then staggered x 10 bilateral (no weight with staggered ) Single leg stance+ opposite leg flat cone tap ( cones forwards, sideways, backwards) x 8 each bilateral  Leg Pres (seat 6) 75# 2 x 10 bilateral ; 35# unilateral x 10 bilateral   PATIENT EDUCATION:  Education details: Initiated HEP Person educated: Patient Education method: Programmer, Multimedia, Facilities Manager, Verbal cues, and Handouts Education comprehension: verbalized understanding, returned demonstration, and verbal cues required  HOME EXERCISE PROGRAM: Access Code: [email or web address] URL: [email or web address] Date: 08/18/2024 Prepared by: Orvil Avary Pitsenbarger  Exercises - Supine ITB Stretch with Strap  - 1 x daily - 7 x weekly - 1 sets - 3 reps - 30 sec  hold - Supine Piriformis Stretch with Foot on Ground  - 1 x daily - 7 x weekly - 1 sets - 3 reps - 30 sec hold - Sidelying IT Band Foam Roll Mobilization  - 1 x daily - 7 x weekly - 3 sets - 10 reps - Active Straight Leg Raise with Quad Set  - 1 x daily - 7 x weekly - 2 sets - 10 reps - Supine Bridge  - 1 x daily - 7 x weekly - 1-2 sets - 10 reps - Seated Long Arc Quad  - 1 x daily - 7 x weekly - 2 sets - 10 reps - Supine Hip Internal and External Rotation  - 1 x daily - 7 x weekly - 1 sets - 10 reps - 5 hold - Hooklying Clamshell with Resistance  - 1 x daily - 7 x weekly - 2 sets - 10 reps - Clamshell  - 1 x daily - 7 x weekly - 2 sets - 10 reps - Sidelying Reverse Clamshell  - 1 x daily - 7 x weekly - 2 sets - 10 reps - Sit to Stand  - 1 x daily - 7 x weekly - 1 sets - 10 reps - Staggered Sit-to-Stand  - 1 x daily - 7 x weekly - 1 sets - 10 reps - Standing March with Unilateral Counter Support  - 1 x daily - 7 x weekly - 3 sets - 10 reps - Modified Eccentric Sit Up in Backless Chair  - 1 x daily - 7 x weekly - 2 sets - 10 reps - Cat Cow  - 1 x daily - 7 x weekly - 1 sets - 10 reps - 5 hold - Child's Pose with Sidebending  - 1 x daily - 7 x weekly - 1 sets - 3 reps - 10 hold - Sidelying Thoracic Rotation with Open Book  - 1 x daily - 7 x weekly - 1 sets - 5 reps - 1-2 breath cycles hold - Supine Lower Trunk Rotation  -  1 x daily - 7 x weekly - 1 sets - 3 reps - 10 hold  ASSESSMENT:  CLINICAL IMPRESSION: Beverley played in a pickle ball tournament on Sat x 3 hours which is double the duration of play time from her usual participation.  She felt good playing but noticed pain, swelling and limp starting Sun.  She does demo some anterior knee swelling pocketing along inferior patellar regions medially and laterally.  ROM is -4 to 115 deg.  Manual techniques, game ready and ROM performed today to target goals of reducing edema and improving pain and ROM.  Encouraged her to continue icing, elevating,  bracing as needed and performing quad sets for ext and heel slides as tol for flexion.    OBJECTIVE IMPAIRMENTS: difficulty walking, decreased strength, increased edema, increased fascial restrictions, increased muscle spasms, impaired flexibility, postural dysfunction, and pain.   ACTIVITY LIMITATIONS: bending, standing, squatting, stairs, and transfers  PARTICIPATION LIMITATIONS: meal prep, cleaning, laundry, driving, shopping, community activity, and yard work  PERSONAL FACTORS: 1-2 comorbidities: previous menisectomy are also affecting patient's functional outcome.   REHAB POTENTIAL: Good  CLINICAL DECISION MAKING: Stable/uncomplicated  EVALUATION COMPLEXITY: Low   GOALS: Goals reviewed with patient? Yes  SHORT TERM GOALS: Target date: 07/26/2024  Patient will be independent with initial HEP  Baseline: Goal status: MET 07/08/24  2.  Pain report to be no greater than 4/10  Baseline:  Goal status: MET 07/08/24  3.  LEFS score to improve by 2 points Baseline:  Goal status: In Progress  4.  Functional scores to improve by at least 1-2 seconds Baseline:  Goal status: In Progress   LONG TERM GOALS: Target date: 08/23/2024  Patient to report pain no greater than 2/10 for both back and lumbar spine Baseline:  Goal status: ongoing 11/13  2.  Patient to be independent with advanced HEP  Baseline:  Goal status: ongoing - progressing challenges for LE and adding lumbar 11/13  3.  Patient to be able to ascend and descend steps without pain or no greater than 2/10  Baseline:  Goal status: ongoing for descending 11/13  4.  Patient to be able to bend, stoop and squat with pain no greater than 2/10  Baseline:  Goal status: ongoing 11/13  5.  Patient to be able to stand or walk for at least 15 min without leg pain  Baseline:  Goal status: MET 11/13  6.  LEFS and Modified ODI score to improve by 3 points or better Baseline:  Goal status: INITIAL  7.  Functional  scores to improve by 2-3 seconds  Baseline:  Goal status: INITIAL  8. Pt will report LBP not to exceed 3/10 during work day with use of HEP and frequent change of positions (using sit stand desk).  Goal status: NEW 11/13   PLAN:  PT FREQUENCY: 1-2x/week  PT DURATION: 8 weeks  PLANNED INTERVENTIONS: 97110-Therapeutic exercises, 97530- Therapeutic activity, 97112- Neuromuscular re-education, 97535- Self Care, [id number]- Manual therapy, [phone]- Gait training, [phone]- Canalith repositioning, [phone]- Aquatic Therapy, [phone]- Electrical stimulation (unattended), [phone]- Electrical stimulation (manual), [id number]- Vasopneumatic device, [id number]- Ultrasound, [id number]- Ionotophoresis 4mg /ml Dexamethasone, [id number] (1-2 muscles), [id number] (3+ muscles)- Dry Needling, Patient/Family education, Balance training, Stair training, Taping, Joint mobilization, Vestibular training, Cryotherapy, and Moist heat  PLAN FOR NEXT SESSION: reassess Lt knee pain, ROM, swelling from set back after pickleball tournament played on 11/22, core strength, continue Lt knee and hip strengthening/stability/balance in closed chain as tolerated.  PT IS INTERESTED IN DN for lumbar  spine as needed  Orvil Fester, PT 08/29/24 5:09 PM  [facility] [address], [address] [place], [place] [address] Phone # [phone] Fax [phone]

## 2024-09-15 ENCOUNTER — Ambulatory Visit: Admitting: Physical Therapy

## 2024-09-20 ENCOUNTER — Ambulatory Visit: Attending: Orthopedic Surgery

## 2024-09-20 DIAGNOSIS — M25562 Pain in left knee: Secondary | ICD-10-CM

## 2024-09-20 DIAGNOSIS — R293 Abnormal posture: Secondary | ICD-10-CM | POA: Insufficient documentation

## 2024-09-20 DIAGNOSIS — M5459 Other low back pain: Secondary | ICD-10-CM

## 2024-09-20 DIAGNOSIS — R252 Cramp and spasm: Secondary | ICD-10-CM

## 2024-09-20 DIAGNOSIS — M6281 Muscle weakness (generalized): Secondary | ICD-10-CM | POA: Diagnosis present

## 2024-09-20 DIAGNOSIS — M25662 Stiffness of left knee, not elsewhere classified: Secondary | ICD-10-CM

## 2024-09-20 DIAGNOSIS — R262 Difficulty in walking, not elsewhere classified: Secondary | ICD-10-CM | POA: Diagnosis present

## 2024-09-20 NOTE — Therapy (Signed)
 OUTPATIENT PHYSICAL THERAPY LOWER EXTREMITY TREATMENT   Patient Name: Sharon Robbins MRN: [id number] DOB:12-31-1965, 58 y.o., female Today's Date: 09/20/2024  END OF SESSION:  PT End of Session - 09/20/24 1238     Visit Number 16    Date for Recertification  10/13/24    Authorization Type Aetna [place] Preferred    Authorization Time Period No auth required    PT Start Time 1230    PT Stop Time 1315    PT Time Calculation (min) 45 min    Activity Tolerance Patient tolerated treatment well    Behavior During Therapy WFL for tasks assessed/performed           Past Medical History:  Diagnosis Date   Headache    History of kidney stones    Past Surgical History:  Procedure Laterality Date   BACK SURGERY     CESAREAN SECTION     EXTRACORPOREAL SHOCK WAVE LITHOTRIPSY Right 05/31/2018   Procedure: RIGHT EXTRACORPOREAL SHOCK WAVE LITHOTRIPSY (ESWL);  Surgeon: Matilda Senior, MD;  Location: [facility];  Service: Urology;  Laterality: Right;   Patient Active Problem List   Diagnosis Date Noted   Overweight 02/15/2014    PCP: No PCP listed  REFERRING PROVIDER: Velinda Chancy, MD  REFERRING DIAG: Left anterior knee pain/IT band  THERAPY DIAG:  Stiffness of left knee, not elsewhere classified  Acute pain of left knee  Difficulty in walking, not elsewhere classified  Muscle weakness (generalized)  Abnormal posture  Cramp and spasm  Other low back pain  Rationale for Evaluation and Treatment: Rehabilitation  ONSET DATE: 06/16/24  SUBJECTIVE:   SUBJECTIVE STATEMENT: Not really a lot better.  I've just rested and not done anything other than my exercises.  Feeling down but had decided she just has to sit back and let it heal and if it's not getting any better,  then do the scope and see what is going on.    From Eval: Patient had meniscus repair 03/06/23 with Dr. Chancy.  (Back fusion 2016)  She did rehab and started playing pickle ball 8 months later.      PERTINENT HISTORY: meniscus repair 03/06/23 with Dr. Chancy. Hx left fibular fx - non operative (2022) (Back fusion 2016)  PAIN:  09/20/24 Are you having pain? Yes: NPRS scale:  knee 2/10 at rest Pain location: lateral joint space Pain description: sharp/aching  Aggravating factors: pickle ball, stairs Relieving factors: ice, ibuprofen  PAIN:  Are you having pain? Yes NPRS scale: 5-6/10 Pain location: central LBP which spreads Rt and Lt Pain orientation: Right, Left, and Lower  PAIN TYPE: dull Pain description: intermittent  Aggravating factors: first thing in AM, playing pickleball, sitting Relieving factors: stand and move   PRECAUTIONS: None  RED FLAGS: None   WEIGHT BEARING RESTRICTIONS: No  FALLS:  Has patient fallen in last 6 months? No  LIVING ENVIRONMENT: Lives with: lives with their spouse Lives in: House/apartment Stairs: No   OCCUPATION: desk work  PLOF: Independent, Independent with basic ADLs, Independent with household mobility without device, Independent with community mobility without device, Independent with homemaking with ambulation, Independent with gait, and Independent with transfers  PATIENT GOALS: To be able to continue playing pickle ball  NEXT MD VISIT: prn  OBJECTIVE:  Note: Objective measures were completed at Evaluation unless otherwise noted.  DIAGNOSTIC FINDINGS: Patient states   PATIENT SURVEYS:  Modified ODI 08/18/24: 16/50  LEFS  Extreme difficulty/unable (0), Quite a bit of difficulty (1), Moderate difficulty (2),  Little difficulty (3), No difficulty (4) Survey date:  06/28/24  Score total:  50/80     COGNITION: Overall cognitive status: Within functional limits for tasks assessed     SENSATION: WFL  EDEMA:  Circumferential: Left: 40 cm, right : 39 cm 08/29/24: circumferential mid-patella both sides 30cm.  Observable edema pocketing medial and lateral lower knee quadrants below patella  MUSCLE LENGTH: 11/13:  WNL bil hamstring length  Hamstrings: 50 bil  Thomas test: Right pos; Left pos  POSTURE: No Significant postural limitations  PALPATION: Milld crepitus left knee on open chain knee extension and flexion  08/18/24: ropey tightness Lt>Rt lumbar and thoracic paraspinals, limited thoracic PA upper and middle thoracic spine, tender Lt gluteals and piriformis  TRUNK ROM: 11/13: full, without pain 11/24: Lt knee flexion 115, Lt knee ext -4.  Rt knee flexion 148, Rt knee ext full  LOWER EXTREMITY ROM:  WFL  LOWER EXTREMITY MMT: Left hip abd, ER and extension 4/5   LOWER EXTREMITY SPECIAL TESTS:  Knee special tests: Held due to hx of menisectomy  FUNCTIONAL TESTS:  07/01/2024 5 times sit to stand: 9.19 sec no UE support. No knee pain Timed up and go (TUG): 6.27 sec    08/25/24  Patient had to leave 10 min early (did not have time) GAIT: Distance walked: 30 feet Assistive device utilized: None Level of assistance: Complete Independence Comments: antalgic start up                                                                                                                                TREATMENT DATE:  09/20/24 Recumbent bike x 5 min level 1 Supine quad set x 20 Supine TKE with small noodle with 4#  2 x 10 Supine TKE with larger noodle with 4# 2 x 10 Supine SAQ with 4# 2 x 10 Supine SLR 2 x 10 with 4# 2 x 10 (6 inch raise to get optimum quad activity) Manual patellar mobs and retrograde massage x 10 min   08/29/24 Discussion about flare up ROM, edema, observation of Lt knee Retrograde massage Lt knee in supine with Lt LE elevated Patellar mobs Gr III medially Heel slides Lt Game ready medium compression 3 flakes Lt knee x 10' Pt educ: continue quad sets for knee ext, heel slides for flexion ROM within painfree range, continue ice/elevation  08/25/24 Nustep x 5 min level 5 Standing hamstring stretch 2 x 30 sec Standing quad/hip flexor stretch 2 x 30 Seated  piriformis stretch 2 x 30 sec Supine PPT x 20 PPT with 90/90 heel tap PPT with dying bug Patient states she has to leave 10 min prior to end of session   PATIENT EDUCATION:  Education details: Initiated HEP Person educated: Patient Education method: Programmer, Multimedia, Facilities Manager, Verbal cues, and Handouts Education comprehension: verbalized understanding, returned demonstration, and verbal cues required  HOME EXERCISE PROGRAM: Access Code: [email or web address] URL: [email or web address] Date: 08/18/2024 Prepared by: Orvil Fester  Exercises - Supine ITB Stretch with Strap  - 1 x daily - 7 x weekly - 1 sets - 3 reps - 30 sec hold - Supine Piriformis Stretch with Foot on Ground  - 1 x daily - 7 x weekly - 1 sets - 3 reps - 30 sec hold - Sidelying IT Band Foam Roll Mobilization  - 1 x daily - 7 x weekly - 3 sets - 10 reps - Active Straight Leg Raise with Quad Set  - 1 x daily - 7 x weekly - 2 sets - 10 reps - Supine Bridge  - 1 x daily - 7 x weekly - 1-2 sets - 10 reps - Seated Long Arc Quad  - 1 x daily - 7 x weekly - 2 sets - 10 reps - Supine Hip Internal and External Rotation  - 1 x daily - 7 x weekly - 1 sets - 10 reps - 5 hold - Hooklying Clamshell with Resistance  - 1 x daily - 7 x weekly - 2 sets - 10 reps - Clamshell  - 1 x daily - 7 x weekly - 2 sets - 10 reps - Sidelying Reverse Clamshell  - 1 x daily - 7 x weekly - 2 sets - 10 reps - Sit to Stand  - 1 x daily - 7 x weekly - 1 sets - 10 reps - Staggered Sit-to-Stand  - 1 x daily - 7 x weekly - 1 sets - 10 reps - Standing March with Unilateral Counter Support  - 1 x daily - 7 x weekly - 3 sets - 10 reps - Modified Eccentric Sit Up in Backless Chair  - 1 x daily - 7 x weekly - 2 sets - 10 reps - Cat Cow  - 1 x daily - 7 x weekly - 1 sets - 10 reps - 5 hold - Child's Pose with Sidebending  - 1 x daily - 7 x weekly - 1 sets - 3 reps - 10 hold - Sidelying Thoracic Rotation with Open Book  - 1 x daily - 7 x weekly - 1 sets - 5  reps - 1-2 breath cycles hold - Supine Lower Trunk Rotation  - 1 x daily - 7 x weekly - 1 sets - 3 reps - 10 hold  ASSESSMENT:  CLINICAL IMPRESSION: Beverley is continuing to experience pain with activity.  She has completely shut down from pickle ball and walking and plans on being down for about 6 weeks.  She has good quad activity with today's quad progression.  We continue to encourage icing, elevating, bracing as needed and performing quad sets for ext and heel slides as tol for flexion. We did all non weight bearing quad activity today to avoid loading the joint space.  She would benefit from continuing skilled PT to monitor quad activity, progress strengthening in a controlled manor and to control pain level.      OBJECTIVE IMPAIRMENTS: difficulty walking, decreased strength, increased edema, increased fascial restrictions, increased muscle spasms, impaired flexibility, postural dysfunction, and pain.   ACTIVITY LIMITATIONS: bending, standing, squatting, stairs, and transfers  PARTICIPATION LIMITATIONS: meal prep, cleaning, laundry, driving, shopping, community activity, and yard work  PERSONAL FACTORS: 1-2 comorbidities: previous menisectomy are also affecting patient's functional outcome.   REHAB POTENTIAL: Good  CLINICAL DECISION MAKING: Stable/uncomplicated  EVALUATION COMPLEXITY: Low   GOALS: Goals reviewed with patient? Yes  SHORT TERM GOALS: Target date: 07/26/2024  Patient will be independent with initial HEP  Baseline: Goal  status: MET 07/08/24  2.  Pain report to be no greater than 4/10  Baseline:  Goal status: MET 07/08/24  3.  LEFS score to improve by 2 points Baseline:  Goal status: In Progress  4.  Functional scores to improve by at least 1-2 seconds Baseline:  Goal status: In Progress   LONG TERM GOALS: Target date: 08/23/2024  Patient to report pain no greater than 2/10 for both back and lumbar spine Baseline:  Goal status: ongoing 11/13  2.   Patient to be independent with advanced HEP  Baseline:  Goal status: ongoing - progressing challenges for LE and adding lumbar 11/13  3.  Patient to be able to ascend and descend steps without pain or no greater than 2/10  Baseline:  Goal status: ongoing for descending 11/13  4.  Patient to be able to bend, stoop and squat with pain no greater than 2/10  Baseline:  Goal status: ongoing 11/13  5.  Patient to be able to stand or walk for at least 15 min without leg pain  Baseline:  Goal status: MET 11/13  6.  LEFS and Modified ODI score to improve by 3 points or better Baseline:  Goal status: INITIAL  7.  Functional scores to improve by 2-3 seconds  Baseline:  Goal status: INITIAL  8. Pt will report LBP not to exceed 3/10 during work day with use of HEP and frequent change of positions (using sit stand desk).  Goal status: NEW 11/13   PLAN:  PT FREQUENCY: 1-2x/week  PT DURATION: 8 weeks  PLANNED INTERVENTIONS: 97110-Therapeutic exercises, 97530- Therapeutic activity, 97112- Neuromuscular re-education, 97535- Self Care, 02859- Manual therapy, [phone]- Gait training, [phone]- Canalith repositioning, [phone]- Aquatic Therapy, [phone]- Electrical stimulation (unattended), [phone]- Electrical stimulation (manual), [id number]- Vasopneumatic device, [id number]- Ultrasound, [id number]- Ionotophoresis 4mg /ml Dexamethasone, 79439 (1-2 muscles), [id number] (3+ muscles)- Dry Needling, Patient/Family education, Balance training, Stair training, Taping, Joint mobilization, Vestibular training, Cryotherapy, and Moist heat  PLAN FOR NEXT SESSION: Assess Lt knee pain, ROM, and swelling from set back after pickleball tournament played on 11/22, core strength, continue Lt knee and hip strengthening/stability/balance in closed chain as tolerated.  PT IS INTERESTED IN DN for lumbar spine as needed  Dequavius Kuhner B. Stacyann Mcconaughy, PT 09/20/2024 3:10 PM [facility] [address], [address] [place], [place]  [address] Phone # [phone] Fax [phone]

## 2024-10-05 ENCOUNTER — Ambulatory Visit

## 2024-10-05 DIAGNOSIS — R262 Difficulty in walking, not elsewhere classified: Secondary | ICD-10-CM

## 2024-10-05 DIAGNOSIS — M25662 Stiffness of left knee, not elsewhere classified: Secondary | ICD-10-CM | POA: Diagnosis not present

## 2024-10-05 DIAGNOSIS — M6281 Muscle weakness (generalized): Secondary | ICD-10-CM

## 2024-10-05 DIAGNOSIS — M25562 Pain in left knee: Secondary | ICD-10-CM

## 2024-10-05 DIAGNOSIS — R293 Abnormal posture: Secondary | ICD-10-CM

## 2024-10-05 DIAGNOSIS — R252 Cramp and spasm: Secondary | ICD-10-CM

## 2024-10-05 NOTE — Therapy (Signed)
 " OUTPATIENT PHYSICAL THERAPY LOWER EXTREMITY TREATMENT   Patient Name: Sharon Robbins MRN: [id number] DOB:1966-06-25, 58 y.o., female Today's Date: 10/05/2024  END OF SESSION:  PT End of Session - 10/05/24 0809     Visit Number 17    Date for Recertification  10/13/24    Authorization Type Aetna [place] Preferred    Authorization Time Period No auth required    PT Start Time 0804    PT Stop Time 0847    PT Time Calculation (min) 43 min    Activity Tolerance Patient tolerated treatment well    Behavior During Therapy [facility] for tasks assessed/performed           Past Medical History:  Diagnosis Date   Headache    History of kidney stones    Past Surgical History:  Procedure Laterality Date   BACK SURGERY     CESAREAN SECTION     EXTRACORPOREAL SHOCK WAVE LITHOTRIPSY Right 05/31/2018   Procedure: RIGHT EXTRACORPOREAL SHOCK WAVE LITHOTRIPSY (ESWL);  Surgeon: Matilda Senior, MD;  Location: [facility];  Service: Urology;  Laterality: Right;   Patient Active Problem List   Diagnosis Date Noted   Overweight 02/15/2014    PCP: No PCP listed  REFERRING PROVIDER: Velinda Chancy, MD  REFERRING DIAG: Left anterior knee pain/IT band  THERAPY DIAG:  Stiffness of left knee, not elsewhere classified  Acute pain of left knee  Difficulty in walking, not elsewhere classified  Muscle weakness (generalized)  Abnormal posture  Cramp and spasm  Rationale for Evaluation and Treatment: Rehabilitation  ONSET DATE: 06/16/24  SUBJECTIVE:   SUBJECTIVE STATEMENT: Patient states it's about the same.  I've got an appt with Dr. Cristy to discuss another scope to see what's going on.      From Eval: Patient had meniscus repair 03/06/23 with Dr. Chancy.  (Back fusion 2016)  She did rehab and started playing pickle ball 8 months later.     PERTINENT HISTORY: meniscus repair 03/06/23 with Dr. Chancy. Hx left fibular fx - non operative (2022) (Back fusion 2016)  PAIN:  10/05/24 Are  you having pain? Yes: NPRS scale:  knee 2/10 at rest Pain location: lateral joint space Pain description: sharp/aching  Aggravating factors: pickle ball, stairs Relieving factors: ice, ibuprofen  PAIN:  Are you having pain? Yes NPRS scale: 5-6/10 Pain location: central LBP which spreads Rt and Lt Pain orientation: Right, Left, and Lower  PAIN TYPE: dull Pain description: intermittent  Aggravating factors: first thing in AM, playing pickleball, sitting Relieving factors: stand and move   PRECAUTIONS: None  RED FLAGS: None   WEIGHT BEARING RESTRICTIONS: No  FALLS:  Has patient fallen in last 6 months? No  LIVING ENVIRONMENT: Lives with: lives with their spouse Lives in: House/apartment Stairs: No   OCCUPATION: desk work  PLOF: Independent, Independent with basic ADLs, Independent with household mobility without device, Independent with community mobility without device, Independent with homemaking with ambulation, Independent with gait, and Independent with transfers  PATIENT GOALS: To be able to continue playing pickle ball  NEXT MD VISIT: prn  OBJECTIVE:  Note: Objective measures were completed at Evaluation unless otherwise noted.  DIAGNOSTIC FINDINGS: Patient states   PATIENT SURVEYS:  Modified ODI 08/18/24: 16/50  LEFS  Extreme difficulty/unable (0), Quite a bit of difficulty (1), Moderate difficulty (2), Little difficulty (3), No difficulty (4) Survey date:  06/28/24  Score total:  50/80     COGNITION: Overall cognitive status: Within functional limits for tasks assessed  SENSATION: WFL  EDEMA:  Circumferential: Left: 40 cm, right : 39 cm 08/29/24: circumferential mid-patella both sides 30cm.  Observable edema pocketing medial and lateral lower knee quadrants below patella  MUSCLE LENGTH: 11/13: WNL bil hamstring length  Hamstrings: 50 bil  Thomas test: Right pos; Left pos  POSTURE: No Significant postural limitations  PALPATION: Milld  crepitus left knee on open chain knee extension and flexion  08/18/24: ropey tightness Lt>Rt lumbar and thoracic paraspinals, limited thoracic PA upper and middle thoracic spine, tender Lt gluteals and piriformis  TRUNK ROM: 11/13: full, without pain 11/24: Lt knee flexion 115, Lt knee ext -4.  Rt knee flexion 148, Rt knee ext full  LOWER EXTREMITY ROM:  WFL  LOWER EXTREMITY MMT: Left hip abd, ER and extension 4/5   LOWER EXTREMITY SPECIAL TESTS:  Knee special tests: Held due to hx of menisectomy  FUNCTIONAL TESTS:  07/01/2024 5 times sit to stand: 9.19 sec no UE support. No knee pain Timed up and go (TUG): 6.27 sec    08/25/24  Patient had to leave 10 min early (did not have time) GAIT: Distance walked: 30 feet Assistive device utilized: None Level of assistance: Complete Independence Comments: antalgic start up                                                                                                                                TREATMENT DATE:  10/05/24 Recumbent bike x 5 min level 1 Supine quad set x 20 Supine TKE with small noodle with 5#  2 x 10 Supine TKE with larger noodle with 5# 2 x 10 Supine SAQ with 5# 2 x 10 Supine SLR 2 x 10 with 5# 2 x 10 (6 inch raise to get optimum quad activity) Lateral band walks with yellow loop x 3 laps of approx 15 feet.    09/20/24 Recumbent bike x 5 min level 1 Supine quad set x 20 Supine TKE with small noodle with 4#  2 x 10 Supine TKE with larger noodle with 4# 2 x 10 Supine SAQ with 4# 2 x 10 Supine SLR 2 x 10 with 4# 2 x 10 (6 inch raise to get optimum quad activity) Manual patellar mobs and retrograde massage x 10 min   08/29/24 Discussion about flare up ROM, edema, observation of Lt knee Retrograde massage Lt knee in supine with Lt LE elevated Patellar mobs Gr III medially Heel slides Lt Game ready medium compression 3 flakes Lt knee x 10' Pt educ: continue quad sets for knee ext, heel slides for flexion  ROM within painfree range, continue ice/elevation   PATIENT EDUCATION:  Education details: Initiated HEP Person educated: Patient Education method: Programmer, Multimedia, Facilities Manager, Verbal cues, and Handouts Education comprehension: verbalized understanding, returned demonstration, and verbal cues required  HOME EXERCISE PROGRAM: Access Code: [email or web address] URL: [email or web address] Date: 08/18/2024 Prepared by: Orvil Beuhring  Exercises - Supine ITB Stretch with Strap  - 1 x daily -  7 x weekly - 1 sets - 3 reps - 30 sec hold - Supine Piriformis Stretch with Foot on Ground  - 1 x daily - 7 x weekly - 1 sets - 3 reps - 30 sec hold - Sidelying IT Band Foam Roll Mobilization  - 1 x daily - 7 x weekly - 3 sets - 10 reps - Active Straight Leg Raise with Quad Set  - 1 x daily - 7 x weekly - 2 sets - 10 reps - Supine Bridge  - 1 x daily - 7 x weekly - 1-2 sets - 10 reps - Seated Long Arc Quad  - 1 x daily - 7 x weekly - 2 sets - 10 reps - Supine Hip Internal and External Rotation  - 1 x daily - 7 x weekly - 1 sets - 10 reps - 5 hold - Hooklying Clamshell with Resistance  - 1 x daily - 7 x weekly - 2 sets - 10 reps - Clamshell  - 1 x daily - 7 x weekly - 2 sets - 10 reps - Sidelying Reverse Clamshell  - 1 x daily - 7 x weekly - 2 sets - 10 reps - Sit to Stand  - 1 x daily - 7 x weekly - 1 sets - 10 reps - Staggered Sit-to-Stand  - 1 x daily - 7 x weekly - 1 sets - 10 reps - Standing March with Unilateral Counter Support  - 1 x daily - 7 x weekly - 3 sets - 10 reps - Modified Eccentric Sit Up in Backless Chair  - 1 x daily - 7 x weekly - 2 sets - 10 reps - Cat Cow  - 1 x daily - 7 x weekly - 1 sets - 10 reps - 5 hold - Child's Pose with Sidebending  - 1 x daily - 7 x weekly - 1 sets - 3 reps - 10 hold - Sidelying Thoracic Rotation with Open Book  - 1 x daily - 7 x weekly - 1 sets - 5 reps - 1-2 breath cycles hold - Supine Lower Trunk Rotation  - 1 x daily - 7 x weekly - 1 sets - 3 reps -  10 hold  ASSESSMENT:  CLINICAL IMPRESSION: Beverley is continuing to experience pain with activity.  She is only doing low level walking now and continues to have symptoms.   We continue to encourage icing, elevating, bracing as needed and performing quad sets for ext and heel slides as tol for flexion. We did all non weight bearing quad activity again today to avoid loading the joint space.  She will be seeing Dr. Cristy for follow up for possible scope.  She would benefit from continuing skilled PT to monitor quad activity, progress strengthening in a controlled manor and to control pain level.      OBJECTIVE IMPAIRMENTS: difficulty walking, decreased strength, increased edema, increased fascial restrictions, increased muscle spasms, impaired flexibility, postural dysfunction, and pain.   ACTIVITY LIMITATIONS: bending, standing, squatting, stairs, and transfers  PARTICIPATION LIMITATIONS: meal prep, cleaning, laundry, driving, shopping, community activity, and yard work  PERSONAL FACTORS: 1-2 comorbidities: previous menisectomy are also affecting patient's functional outcome.   REHAB POTENTIAL: Good  CLINICAL DECISION MAKING: Stable/uncomplicated  EVALUATION COMPLEXITY: Low   GOALS: Goals reviewed with patient? Yes  SHORT TERM GOALS: Target date: 07/26/2024  Patient will be independent with initial HEP  Baseline: Goal status: MET 07/08/24  2.  Pain report to be no greater than 4/10  Baseline:  Goal status: MET 07/08/24  3.  LEFS score to improve by 2 points Baseline:  Goal status: In Progress  4.  Functional scores to improve by at least 1-2 seconds Baseline:  Goal status: In Progress   LONG TERM GOALS: Target date: 08/23/2024  Patient to report pain no greater than 2/10 for both back and lumbar spine Baseline:  Goal status: ongoing 11/13  2.  Patient to be independent with advanced HEP  Baseline:  Goal status: ongoing - progressing challenges for LE and adding lumbar  11/13  3.  Patient to be able to ascend and descend steps without pain or no greater than 2/10  Baseline:  Goal status: ongoing for descending 11/13  4.  Patient to be able to bend, stoop and squat with pain no greater than 2/10  Baseline:  Goal status: ongoing 11/13  5.  Patient to be able to stand or walk for at least 15 min without leg pain  Baseline:  Goal status: MET 11/13  6.  LEFS and Modified ODI score to improve by 3 points or better Baseline:  Goal status: INITIAL  7.  Functional scores to improve by 2-3 seconds  Baseline:  Goal status: INITIAL  8. Pt will report LBP not to exceed 3/10 during work day with use of HEP and frequent change of positions (using sit stand desk).  Goal status: NEW 11/13   PLAN:  PT FREQUENCY: 1-2x/week  PT DURATION: 8 weeks  PLANNED INTERVENTIONS: 97110-Therapeutic exercises, 97530- Therapeutic activity, 97112- Neuromuscular re-education, 97535- Self Care, 02859- Manual therapy, [phone]- Gait training, [phone]- Canalith repositioning, [phone]- Aquatic Therapy, [phone]- Electrical stimulation (unattended), [phone]- Electrical stimulation (manual), [id number]- Vasopneumatic device, [id number]- Ultrasound, [id number]- Ionotophoresis 4mg /ml Dexamethasone, 79439 (1-2 muscles), [id number] (3+ muscles)- Dry Needling, Patient/Family education, Balance training, Stair training, Taping, Joint mobilization, Vestibular training, Cryotherapy, and Moist heat  PLAN FOR NEXT SESSION: Monitor Lt knee pain, ROM, and swelling from set back after pickleball tournament played on 11/22, core strength, continue Lt knee and hip strengthening/stability/balance in closed chain as tolerated.  PT IS INTERESTED IN DN for lumbar spine as needed  Shaylan Tutton B. Brenetta Penny, PT 10/05/2024 9:18 AM [facility] [address] [place], [place] [address] Phone # [phone] Fax [phone]  "

## 2024-10-12 ENCOUNTER — Ambulatory Visit: Attending: Orthopedic Surgery

## 2024-10-12 DIAGNOSIS — M25662 Stiffness of left knee, not elsewhere classified: Secondary | ICD-10-CM | POA: Insufficient documentation

## 2024-10-12 DIAGNOSIS — M6281 Muscle weakness (generalized): Secondary | ICD-10-CM | POA: Insufficient documentation

## 2024-10-12 DIAGNOSIS — R262 Difficulty in walking, not elsewhere classified: Secondary | ICD-10-CM | POA: Diagnosis present

## 2024-10-12 DIAGNOSIS — R293 Abnormal posture: Secondary | ICD-10-CM | POA: Diagnosis present

## 2024-10-12 DIAGNOSIS — M25562 Pain in left knee: Secondary | ICD-10-CM | POA: Insufficient documentation

## 2024-10-12 DIAGNOSIS — R252 Cramp and spasm: Secondary | ICD-10-CM | POA: Insufficient documentation

## 2024-10-12 NOTE — Therapy (Signed)
 " OUTPATIENT PHYSICAL THERAPY LOWER EXTREMITY TREATMENT/RECERT   Patient Name: Sharon Robbins MRN: [id number] DOB:[birth year], 59 y.o., female Today's Date: 10/12/2024  END OF SESSION:  PT End of Session - 10/12/24 1639     Visit Number 18    Date for Recertification  10/13/24    Authorization Type [employer] [place] [employer]erred    Authorization Time Period No auth required    PT Start Time 1620    PT Stop Time 1710    PT Time Calculation (min) 50 min    Activity Tolerance Patient tolerated treatment well    Behavior During Therapy WFL for tasks assessed/performed           Past Medical History:  Diagnosis Date   Headache    History of kidney stones    Past Surgical History:  Procedure Laterality Date   BACK SURGERY     CESAREAN SECTION     EXTRACORPOREAL SHOCK WAVE LITHOTRIPSY Right 05/31/2018   Procedure: RIGHT EXTRACORPOREAL SHOCK WAVE LITHOTRIPSY (ESWL);  Surgeon: Matilda Senior, MD;  Location: [facility];  Service: Urology;  Laterality: Right;   Patient Active Problem List   Diagnosis Date Noted   Overweight 02/15/2014    PCP: No PCP listed  REFERRING PROVIDER: Velinda Chancy, MD  REFERRING DIAG: Left anterior knee pain/IT band  THERAPY DIAG:  Stiffness of left knee, not elsewhere classified - Plan: PT plan of care cert/re-cert  Acute pain of left knee - Plan: PT plan of care cert/re-cert  Difficulty in walking, not elsewhere classified - Plan: PT plan of care cert/re-cert  Muscle weakness (generalized) - Plan: PT plan of care cert/re-cert  Abnormal posture - Plan: PT plan of care cert/re-cert  Cramp and spasm - Plan: PT plan of care cert/re-cert  Rationale for Evaluation and Treatment: Rehabilitation  ONSET DATE: 06/16/24  SUBJECTIVE:   SUBJECTIVE STATEMENT: Patient states I switched doctors because we know Dr. Kay well.  He spent a lot of time with me and he suggested that I stop pickle ball and any activities like yoga that cause set backs and focus on  high level quad rehab.    From Eval: Patient had meniscus repair 03/06/23 with Dr. Chancy.  (Back fusion 2016)  She did rehab and started playing pickle ball 8 months later.     PERTINENT HISTORY: meniscus repair 03/06/23 with Dr. Chancy. Hx left fibular fx - non operative (2022) (Back fusion 2016)  PAIN:  10/12/24 Are you having pain? Yes: NPRS scale:  knee 2/10 at rest Pain location: lateral joint space Pain description: sharp/aching  Aggravating factors: pickle ball, stairs Relieving factors: ice, ibuprofen    PRECAUTIONS: None  RED FLAGS: None   WEIGHT BEARING RESTRICTIONS: No  FALLS:  Has patient fallen in last 6 months? No  LIVING ENVIRONMENT: Lives with: lives with their spouse Lives in: House/apartment Stairs: No   OCCUPATION: desk work  PLOF: Independent, Independent with basic ADLs, Independent with household mobility without device, Independent with community mobility without device, Independent with homemaking with ambulation, Independent with gait, and Independent with transfers  PATIENT GOALS: To be able to continue playing pickle ball  NEXT MD VISIT: prn  OBJECTIVE:  Note: Objective measures were completed at Evaluation unless otherwise noted.  DIAGNOSTIC FINDINGS: Patient states   PATIENT SURVEYS:  Modified ODI 08/18/24: 16/50  LEFS  Extreme difficulty/unable (0), Quite a bit of difficulty (1), Moderate difficulty (2), Little difficulty (3), No difficulty (4) Survey date:  06/28/24 10/12/24  Score total:  50/80  52/80     COGNITION: Overall cognitive status: Within functional limits for tasks assessed     SENSATION: WFL  EDEMA:  Circumferential: Left: 40 cm, right : 39 cm 08/29/24: circumferential mid-patella both sides 30cm.  Observable edema pocketing medial and lateral lower knee quadrants below patella 10/12/24:  Left 36 cm, right 30  MUSCLE LENGTH: 11/13: WNL bil hamstring length  Hamstrings: 50 bil  Thomas test: Right pos; Left  pos  POSTURE: No Significant postural limitations  PALPATION: Milld crepitus left knee on open chain knee extension and flexion  08/18/24: ropey tightness Lt>Rt lumbar and thoracic paraspinals, limited thoracic PA upper and middle thoracic spine, tender Lt gluteals and piriformis  TRUNK ROM: 11/13: full, without pain 11/24: Lt knee flexion 115, Lt knee ext -4.  Rt knee flexion 148, Rt knee ext full  LOWER EXTREMITY ROM:  WFL  LOWER EXTREMITY MMT: Left hip abd, ER and extension 4/5  10/12/24: Generally 4 to 4+/5 but instability when pain increases with activity  LOWER EXTREMITY SPECIAL TESTS:  Knee special tests: Held due to hx of menisectomy  FUNCTIONAL TESTS:  07/01/2024 5 times sit to stand: 9.19 sec no UE support. No knee pain Timed up and go (TUG): 6.27 sec    08/25/24  Patient had to leave 10 min early (did not have time)   10/12/24  Patient scores fairly normal with these tests due to generally strong but if she were having an exacerbation such as after playing pickle ball, she would likely score lower.    GAIT: Distance walked: 30 feet Assistive device utilized: None Level of assistance: Complete Independence Comments: antalgic start up                                                                                                                                TREATMENT DATE:  10/12/24 Recumbent bike x 8 min level 1 (PT present to discuss status, meeting with MD and plan) Squats to table x 10 immediately followed by squat pulses x 15 seconds, then squat hold x 15 sec (2 sets of this) Single leg squat with right heel on floor out front: to 24 side of rit fit box 2 x 10 Seated LAQ with 5 lb ankle weight 2 x 10 left 6 step up 2 x 10 left Leg press with 110 lbs x 10, decreased to 90 lbs for second set bilateral x 10 Leg press single (left) 2 x 10 with 50 lbs Lateral band walk with yellow loop x 3 laps of 15 feet    10/05/24 Recumbent bike x 5 min level  1 Supine quad set x 20 Supine TKE with small noodle with 5#  2 x 10 Supine TKE with larger noodle with 5# 2 x 10 Supine SAQ with 5# 2 x 10 Supine SLR 2 x 10 with 5# 2 x 10 (6 inch raise to get optimum quad activity) Lateral band walks with yellow loop  x 3 laps of approx 15 feet.    09/20/24 Recumbent bike x 5 min level 1 Supine quad set x 20 Supine TKE with small noodle with 4#  2 x 10 Supine TKE with larger noodle with 4# 2 x 10 Supine SAQ with 4# 2 x 10 Supine SLR 2 x 10 with 4# 2 x 10 (6 inch raise to get optimum quad activity) Manual patellar mobs and retrograde massage x 10 min   08/29/24 Discussion about flare up ROM, edema, observation of Lt knee Retrograde massage Lt knee in supine with Lt LE elevated Patellar mobs Gr III medially Heel slides Lt Game ready medium compression 3 flakes Lt knee x 10' Pt educ: continue quad sets for knee ext, heel slides for flexion ROM within painfree range, continue ice/elevation   PATIENT EDUCATION:  Education details: Initiated HEP Person educated: Patient Education method: Programmer, Multimedia, Facilities Manager, Verbal cues, and Handouts Education comprehension: verbalized understanding, returned demonstration, and verbal cues required  HOME EXERCISE PROGRAM: Access Code: [email or web address] URL: [email or web address] Date: 10/12/2024 Prepared by: Delon Haddock  Exercises - Squat with Chair Touch  - 1 x daily - 7 x weekly - 3 sets - 10 reps (written instructions to do 10 squats, followed by 15 sec pulse squat followed by 15 sec squat hold) - Seated Long Arc Quad  - 1 x daily - 7 x weekly - 2 sets - 10 reps - Single Leg Squat with Chair Touch  - 1 x daily - 7 x weekly - 3 sets - 10 reps - Supine ITB Stretch with Strap  - 1 x daily - 7 x weekly - 1 sets - 3 reps - 30 sec hold - Clamshell  - 1 x daily - 7 x weekly - 2 sets - 10 reps - Side Stepping with Resistance at Ankles  - 1 x daily - 7 x weekly - 3 sets - 10 reps - Full Leg Press  -  1 x daily - 7 x weekly - 2 sets - 10 reps - Single Leg Knee Extension with Weight Machine  - 1 x daily - 7 x weekly - 2 sets - 10 reps  ASSESSMENT:  CLINICAL IMPRESSION: Beverley saw Dr. Kay instead of re-visiting Dr. Cristy or Dr. Beverley due to husband worked with him previously.  He suggested that she discontinue pickle ball, and any classes that incite pain and focus heavily on higher level quad rehab.  We started with this today.  She had no pain but she fatigued quickly.  We updated her HEP to focus heavily on quad and hip strength.  We will recertify for 8 more weeks to assess whether she will respond to more focused quad rehab.     OBJECTIVE IMPAIRMENTS: difficulty walking, decreased strength, increased edema, increased fascial restrictions, increased muscle spasms, impaired flexibility, postural dysfunction, and pain.   ACTIVITY LIMITATIONS: bending, standing, squatting, stairs, and transfers  PARTICIPATION LIMITATIONS: meal prep, cleaning, laundry, driving, shopping, community activity, and yard work  PERSONAL FACTORS: 1-2 comorbidities: previous menisectomy are also affecting patient's functional outcome.   REHAB POTENTIAL: Good  CLINICAL DECISION MAKING: Stable/uncomplicated  EVALUATION COMPLEXITY: Low   GOALS: Goals reviewed with patient? Yes  SHORT TERM GOALS: Target date: 07/26/2024  Patient will be independent with initial HEP  Baseline: Goal status: MET 07/08/24  2.  Pain report to be no greater than 4/10  Baseline:  Goal status: MET 07/08/24  3.  LEFS score to improve by 2 points Baseline:  Goal status:  In Progress  4.  Functional scores to improve by at least 1-2 seconds Baseline:  Goal status: In Progress   LONG TERM GOALS: Target date: 08/23/2024  Patient to report pain no greater than 2/10 for both back and lumbar spine Baseline:  Goal status: ongoing 11/13  2.  Patient to be independent with advanced HEP  Baseline:  Goal status: ongoing -  progressing challenges for LE and adding lumbar 11/13  3.  Patient to be able to ascend and descend steps without pain or no greater than 2/10  Baseline:  Goal status: ongoing for descending 11/13  4.  Patient to be able to bend, stoop and squat with pain no greater than 2/10  Baseline:  Goal status: ongoing 11/13  5.  Patient to be able to stand or walk for at least 15 min without leg pain  Baseline:  Goal status: MET 11/13  6.  LEFS and Modified ODI score to improve by 3 points or better Baseline:  Goal status: INITIAL  7.  Functional scores to improve by 2-3 seconds  Baseline:  Goal status: INITIAL  8. Pt will report LBP not to exceed 3/10 during work day with use of HEP and frequent change of positions (using sit stand desk).  Goal status: NEW 11/13   PLAN:  PT FREQUENCY: 1-2x/week  PT DURATION: 8 weeks  PLANNED INTERVENTIONS: 97110-Therapeutic exercises, 97530- Therapeutic activity, 97112- Neuromuscular re-education, 97535- Self Care, [id number]- Manual therapy, [id number]- Gait training, [phone]- Canalith repositioning, [id number]- Aquatic Therapy, [phone]- Electrical stimulation (unattended), [phone]- Electrical stimulation (manual), [id number]- Vasopneumatic device, [id number]- Ultrasound, [id number]- Ionotophoresis 4mg /ml Dexamethasone, [id number] (1-2 muscles), [id number] (3+ muscles)- Dry Needling, Patient/Family education, Balance training, Stair training, Taping, Joint mobilization, Vestibular training, Cryotherapy, and Moist heat  PLAN FOR NEXT SESSION: Recertifying for 8 more weeks 1-2 times per week.  Monitor Lt knee pain.  Heavy quad rehab emphasis.  Monitor back pain.    Delon B. Jerauld Bostwick, PT 10/12/2024 5:44 PM [facility] [address], [address] [place], [place] [address] Phone # [phone] Fax [phone]  "
